# Patient Record
Sex: Male | Born: 1970 | Race: White | Hispanic: No | Marital: Married | Smoking: Current every day smoker
Health system: Southern US, Community
[De-identification: ages and names within clinical notes are randomized; demographics above are authoritative.]

## PROBLEM LIST (undated history)

## (undated) DIAGNOSIS — M419 Scoliosis, unspecified: Secondary | ICD-10-CM

## (undated) HISTORY — DX: Scoliosis, unspecified: M41.9

---

## 2016-12-12 ENCOUNTER — Encounter: Payer: Self-pay | Admitting: *Deleted

## 2016-12-13 ENCOUNTER — Encounter: Payer: Self-pay | Admitting: Diagnostic Neuroimaging

## 2016-12-13 ENCOUNTER — Inpatient Hospital Stay (HOSPITAL_COMMUNITY)
Admission: EM | Admit: 2016-12-13 | Discharge: 2016-12-18 | DRG: 098 | Disposition: A | Discharge status: Home / Self Care | Payer: Managed Care, Other (non HMO) | Attending: Family Medicine | Admitting: Family Medicine

## 2016-12-13 ENCOUNTER — Ambulatory Visit (INDEPENDENT_AMBULATORY_CARE_PROVIDER_SITE_OTHER): Payer: Managed Care, Other (non HMO) | Admitting: Diagnostic Neuroimaging

## 2016-12-13 ENCOUNTER — Emergency Department (HOSPITAL_COMMUNITY): Payer: Managed Care, Other (non HMO)

## 2016-12-13 ENCOUNTER — Encounter (HOSPITAL_COMMUNITY): Payer: Self-pay

## 2016-12-13 VITALS — BP 125/83 | HR 64 | Ht 68.0 in | Wt 162.2 lb

## 2016-12-13 DIAGNOSIS — E559 Vitamin D deficiency, unspecified: Secondary | ICD-10-CM | POA: Diagnosis present

## 2016-12-13 DIAGNOSIS — M5106 Intervertebral disc disorders with myelopathy, lumbar region: Secondary | ICD-10-CM | POA: Diagnosis present

## 2016-12-13 DIAGNOSIS — R29898 Other symptoms and signs involving the musculoskeletal system: Secondary | ICD-10-CM

## 2016-12-13 DIAGNOSIS — R200 Anesthesia of skin: Secondary | ICD-10-CM

## 2016-12-13 DIAGNOSIS — G373 Acute transverse myelitis in demyelinating disease of central nervous system: Principal | ICD-10-CM | POA: Diagnosis present

## 2016-12-13 DIAGNOSIS — M5000 Cervical disc disorder with myelopathy, unspecified cervical region: Secondary | ICD-10-CM | POA: Diagnosis present

## 2016-12-13 DIAGNOSIS — R202 Paresthesia of skin: Secondary | ICD-10-CM

## 2016-12-13 DIAGNOSIS — G959 Disease of spinal cord, unspecified: Secondary | ICD-10-CM | POA: Diagnosis present

## 2016-12-13 DIAGNOSIS — D72828 Other elevated white blood cell count: Secondary | ICD-10-CM | POA: Diagnosis not present

## 2016-12-13 DIAGNOSIS — F17210 Nicotine dependence, cigarettes, uncomplicated: Secondary | ICD-10-CM | POA: Diagnosis present

## 2016-12-13 DIAGNOSIS — Z79899 Other long term (current) drug therapy: Secondary | ICD-10-CM

## 2016-12-13 DIAGNOSIS — R292 Abnormal reflex: Secondary | ICD-10-CM | POA: Diagnosis not present

## 2016-12-13 DIAGNOSIS — E538 Deficiency of other specified B group vitamins: Secondary | ICD-10-CM | POA: Diagnosis present

## 2016-12-13 DIAGNOSIS — M419 Scoliosis, unspecified: Secondary | ICD-10-CM | POA: Diagnosis present

## 2016-12-13 LAB — CBC
HEMATOCRIT: 48.1 % (ref 39.0–52.0)
Hemoglobin: 16.8 g/dL (ref 13.0–17.0)
MCH: 31.8 pg (ref 26.0–34.0)
MCHC: 34.9 g/dL (ref 30.0–36.0)
MCV: 90.9 fL (ref 78.0–100.0)
Platelets: 256 10*3/uL (ref 150–400)
RBC: 5.29 MIL/uL (ref 4.22–5.81)
RDW: 12.7 % (ref 11.5–15.5)
WBC: 13.6 10*3/uL — AB (ref 4.0–10.5)

## 2016-12-13 LAB — BASIC METABOLIC PANEL
Anion gap: 8 (ref 5–15)
BUN: 10 mg/dL (ref 6–20)
CO2: 25 mmol/L (ref 22–32)
Calcium: 9.1 mg/dL (ref 8.9–10.3)
Chloride: 107 mmol/L (ref 101–111)
Creatinine, Ser: 1.03 mg/dL (ref 0.61–1.24)
GFR calc Af Amer: >60 mL/min (ref >60)
GFR calc non Af Amer: >60 mL/min (ref >60)
Glucose, Bld: 95 mg/dL (ref 65–99)
Potassium: 3.8 mmol/L (ref 3.5–5.1)
Sodium: 140 mmol/L (ref 135–145)

## 2016-12-13 MED ORDER — GABAPENTIN 300 MG PO CAPS: 300.0000 mg | ORAL_CAPSULE | Freq: Three times a day (TID) | ORAL | 6 refills | Status: DC

## 2016-12-13 MED ORDER — SODIUM CHLORIDE 0.9% FLUSH
3.0000 mL | Freq: Two times a day (BID) | INTRAVENOUS | Status: DC
  Administered 2016-12-13 – 2016-12-18 (×10): 3 mL via INTRAVENOUS

## 2016-12-13 MED ORDER — GABAPENTIN 300 MG PO CAPS
300.0000 mg | ORAL_CAPSULE | Freq: Three times a day (TID) | ORAL | Status: DC
  Administered 2016-12-14 – 2016-12-18 (×13): 300 mg via ORAL
  Filled 2016-12-13 (×13): qty 1

## 2016-12-13 MED ORDER — LORAZEPAM 2 MG/ML IJ SOLN
1.0000 mg | Freq: Once | INTRAMUSCULAR | Status: AC
  Administered 2016-12-13: 1 mg via INTRAVENOUS
  Filled 2016-12-13: qty 1

## 2016-12-13 MED ORDER — SODIUM CHLORIDE 0.9% FLUSH: 3.0000 mL | INTRAVENOUS | Status: DC | PRN

## 2016-12-13 MED ORDER — GADOBENATE DIMEGLUMINE 529 MG/ML IV SOLN
15.0000 mL | Freq: Once | INTRAVENOUS | Status: AC
  Administered 2016-12-13: 15 mL via INTRAVENOUS

## 2016-12-13 MED ORDER — SODIUM CHLORIDE 0.9 % IV SOLN: 250.0000 mL | INTRAVENOUS | Status: DC | PRN

## 2016-12-13 NOTE — ED Provider Notes (Signed)
Sent for mri.  c/f spinal cord compression syndrome.  MR brain, cervical, lumbar, and thoracic pending.  Numbness in sole of feet over 2 week with saddle anesthesia developing over past week as well as left shoulder pain.  Results of MRI show intramedullary t11 lesion  Discussed findings with neurology. Will recommend admission for LP and further workup. Pt will be admitted to medicine for further management and evaluaiton.    Chad Webb, Chad Schwieger, MD 12/14/16 1331    Blane OharaZavitz, Joshua, MD 12/14/16 [phone]

## 2016-12-13 NOTE — ED Triage Notes (Signed)
Pt presents for MRI from neurologist, Penemalli MD. Reports had 2 weeks of bilateral feet numbness, denies weakness. Ambulatory, AxO x4. Pt denies other medical problems or injury.

## 2016-12-13 NOTE — H&P (Signed)
History and Physical    Chad MayersBarry Bogdon HYQ:[id number] DOB: 1970-12-18 DOA: 12/13/2016  PCP: Roger KillWilliams, Breejante J, PA-C  Patient coming from:  Home, neuro clinic  Chief Complaint:   numbness  HPI: Chad MayersBarry Favorite is a 46 y.o. male with medical history significant of scoliosis sent in for stat MRI of his back by neuro clinic for one week of bilateral feet numbness and tingling along with numbness to his perineal area.  He reports no urinary or bowel incontinence.  No fevers.  No headache.  No blurred vision.  No recent trauma or vehicle accident.  Pt went to be evaluated in neuro clinic who sent to ED for stat imaging.  Mri shows lesion In lower spine.  Neuro here called who advised obs pt for possible LP work up.  Pt has not been on steroids recently.  Has no h/o multiple sclerosis.   Review of Systems: As per HPI otherwise 10 point review of systems negative.   Past Medical History:  Diagnosis Date  . Scoliosis     History reviewed. No pertinent surgical history.   reports that he has been smoking.  He has been smoking about 1.00 pack per day. He has never used smokeless tobacco. He reports that he drinks alcohol. He reports that he does not use drugs.  Allergies  Allergen Reactions  . Latex Rash    Family History  Problem Relation Age of Onset  . Ataxia Maternal Grandmother     Prior to Admission medications   Medication Sig Start Date End Date Taking? Authorizing Provider  cyanocobalamin 500 MCG tablet Take 500 mcg by mouth daily.   Yes [provider]  gabapentin (NEURONTIN) 300 MG capsule Take 1 capsule (300 mg total) by mouth 3 (three) times daily. Patient not taking: Reported on 12/13/2016 12/13/16   Suanne MarkerPenumalli, Vikram R, MD    Physical Exam: Vitals:   12/13/16 1230 12/13/16 1315 12/13/16 1330 12/13/16 1345  BP: 130/87 (!) 133/92 (!) 140/91 (!) 132/93  Pulse: (!) 57 (!) 55 (!) 53 64  Resp:   16 16  Temp:      TempSrc:      SpO2: 98% 97% 98% 98%     Constitutional: NAD, calm, comfortable Vitals:   12/13/16 1230 12/13/16 1315 12/13/16 1330 12/13/16 1345  BP: 130/87 (!) 133/92 (!) 140/91 (!) 132/93  Pulse: (!) 57 (!) 55 (!) 53 64  Resp:   16 16  Temp:      TempSrc:      SpO2: 98% 97% 98% 98%   Eyes: PERRL, lids and conjunctivae normal ENMT: Mucous membranes are moist. Posterior pharynx clear of any exudate or lesions.Normal dentition.  Neck: normal, supple, no masses, no thyromegaly Respiratory: clear to auscultation bilaterally, no wheezing, no crackles. Normal respiratory effort. No accessory muscle use.  Cardiovascular: Regular rate and rhythm, no murmurs / rubs / gallops. No extremity edema. 2+ pedal pulses. No carotid bruits.  Abdomen: no tenderness, no masses palpated. No hepatosplenomegaly. Bowel sounds positive.  Musculoskeletal: no clubbing / cyanosis. No joint deformity upper and lower extremities. Good ROM, no contractures. Normal muscle tone.  Skin: no rashes, lesions, ulcers. No induration Neurologic: CN 2-12 grossly intact.  DTR normal. Strength 5/5 in all 4.  Psychiatric: Normal judgment and insight. Alert and oriented x 3. Normal mood.    Labs on Admission: I have personally reviewed following labs and imaging studies  CBC: No results for input(s): WBC, NEUTROABS, HGB, HCT, MCV, PLT in the last 168 hours.  Basic Metabolic Panel:  Recent Labs Lab 12/13/16 1155  NA 140  K 3.8  CL 107  CO2 25  GLUCOSE 95  BUN 10  CREATININE 1.03  CALCIUM 9.1   GFR: Estimated Creatinine Clearance: 85.8 mL/min (by C-G formula based on SCr of 1.03 mg/dL).  Radiological Exams on Admission: Mr Laqueta Jean And Wo Contrast  Result Date: 12/13/2016 CLINICAL DATA:  Myelopathy. EXAM: MRI HEAD WITHOUT AND WITH CONTRAST TECHNIQUE: Multiplanar, multiecho pulse sequences of the brain and surrounding structures were obtained without and with intravenous contrast. CONTRAST:  15mL MULTIHANCE GADOBENATE DIMEGLUMINE 529 MG/ML IV SOLN  COMPARISON:  None. FINDINGS: Brain: 2 small FLAIR hyperintensities in the cerebral white matter. No specific demyelinating pattern. No signal abnormality along the third ventricle or cerebral aqua duct. No acute infarct, hemorrhage, hydrocephalus, or mass. Normal brain volume. Vascular: Normal flow voids. Skull and upper cervical spine: Negative. Sinuses/Orbits: No evidence of optic neuritis. Small mucous retention cysts in the right sphenoid sinus. IMPRESSION: No explanation for myelopathy.  No demyelinating pattern. Electronically Signed   By: Marnee Spring M.D.   On: 12/13/2016 17:38   Mr Cervical Spine W Or Wo Contrast  Result Date: 12/13/2016 CLINICAL DATA:  Paresthesia on the plantar feet. Saddle anesthesia. Left shoulder sensation abnormality. EXAM: MRI CERVICAL, THORACIC AND LUMBAR SPINE WITHOUT CONTRAST TECHNIQUE: Multiplanar and multiecho pulse sequences of the cervical spine, to include the craniocervical junction and cervicothoracic junction, and thoracic and lumbar spine, were obtained without intravenous contrast. COMPARISON:  None. FINDINGS: MRI CERVICAL SPINE FINDINGS Alignment: Physiologic. Vertebrae: No fracture, evidence of discitis, or bone lesion. Cord: Normal signal and morphology. Posterior Fossa, vertebral arteries, paraspinal tissues: Negative. Disc levels: C2-3: Unremarkable. C3-4: Shallow disc protrusion and endplate ridging central to right paracentral. No impingement C4-5: Unremarkable. C5-6: Shallow rightward disc protrusion and minimal endplate ridging. Patent canal and foramina C6-7: Left foraminal protrusion or uncovertebral ridge with mild stenosis (axial T2 weighted imaging over estimates narrowing due to slice selection). Mild posterior endplate ridging and disc bulging. C7-T1:Unremarkable. MRI THORACIC SPINE FINDINGS Alignment: Exaggerated kyphosis. 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  Vertebrae: Multiple endplate irregularities. No acute fracture, discitis, or aggressive bone lesion. Cord: There is a discrete 8 mm intramedullary signal abnormality with subtle expansion and hazy faint enhancement. The lesion was skipped over on axial images but appears to be central in the cord. Differential considerations: Demyelination- this would be a solitary lesion as there is no specific demyelinating pattern in the brain or elsewhere in the cord. This is a short segment lesion unlike transverse myelitis. Neoplasm- hemangioblastoma or ependymoma could have this appearance, hemangioma is usually more hypervascular and ependymoma is usually larger. This would be a small lesion for astrocytoma. Medullary metastases rare and there is no history of malignancy. Infection- short-segment, unlike transverse myelitis. No ring enhancement for infectious collection. Vascular- this would be an atypical pattern. Posttraumatic - no posttraumatic soft tissue/osseous findings. This is not at a disc level to suggest contusion. Inflammatory- neurosarcoid should be considered. Paraspinal and other soft tissues: Negative Disc levels: T5-6: Central disc herniation contacting the ventral cord with ventral cord flattening MRI LUMBAR SPINE FINDINGS Segmentation:  Standard. Alignment:  Physiologic. Vertebrae:  No fracture, evidence of discitis, or bone lesion. Conus medullaris: Extends to the L1 level and appears normal. Paraspinal and other soft tissues: Negative Disc levels: L5-S1: Disc protrusion eccentric to the right. This could contact the S1 nerve root but no  mass effect. IMPRESSION: 1. 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 Please see above differential diagnosis in the thoracic spine "cord" header. 2. Overall mild degenerative changes in the cervical, thoracic, and lumbar spine which are described above. No high-grade stenosis. Electronically Signed   By: Marnee Spring M.D.   On: 12/13/2016 17:34    Mr Thoracic Spine W Wo Contrast  Result Date: 12/13/2016 CLINICAL DATA:  Paresthesia on the plantar feet. Saddle anesthesia. Left shoulder sensation abnormality. EXAM: MRI CERVICAL, THORACIC AND LUMBAR SPINE WITHOUT CONTRAST TECHNIQUE: Multiplanar and multiecho pulse sequences of the cervical spine, to include the craniocervical junction and cervicothoracic junction, and thoracic and lumbar spine, were obtained without intravenous contrast. COMPARISON:  None. FINDINGS: MRI CERVICAL SPINE FINDINGS Alignment: Physiologic. Vertebrae: No fracture, evidence of discitis, or bone lesion. Cord: Normal signal and morphology. Posterior Fossa, vertebral arteries, paraspinal tissues: Negative. Disc levels: C2-3: Unremarkable. C3-4: Shallow disc protrusion and endplate ridging central to right paracentral. No impingement C4-5: Unremarkable. C5-6: Shallow rightward disc protrusion and minimal endplate ridging. Patent canal and foramina C6-7: Left foraminal protrusion or uncovertebral ridge with mild stenosis (axial T2 weighted imaging over estimates narrowing due to slice selection). Mild posterior endplate ridging and disc bulging. C7-T1:Unremarkable. MRI THORACIC SPINE FINDINGS Alignment: Exaggerated kyphosis. 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 Vertebrae: Multiple endplate irregularities. No acute fracture, discitis, or aggressive bone lesion. Cord: There is a discrete 8 mm intramedullary signal abnormality with subtle expansion and hazy faint enhancement. The lesion was skipped over on axial images but appears to be central in the cord. Differential considerations: Demyelination- this would be a solitary lesion as there is no specific demyelinating pattern in the brain or elsewhere in the cord. This is a short segment lesion unlike transverse myelitis. Neoplasm- hemangioblastoma or ependymoma could have this appearance, hemangioma  is usually more hypervascular and ependymoma is usually larger. This would be a small lesion for astrocytoma. Medullary metastases rare and there is no history of malignancy. Infection- short-segment, unlike transverse myelitis. No ring enhancement for infectious collection. Vascular- this would be an atypical pattern. Posttraumatic - no posttraumatic soft tissue/osseous findings. This is not at a disc level to suggest contusion. Inflammatory- neurosarcoid should be considered. Paraspinal and other soft tissues: Negative Disc levels: T5-6: Central disc herniation contacting the ventral cord with ventral cord flattening MRI LUMBAR SPINE FINDINGS Segmentation:  Standard. Alignment:  Physiologic. Vertebrae:  No fracture, evidence of discitis, or bone lesion. Conus medullaris: Extends to the L1 level and appears normal. Paraspinal and other soft tissues: Negative Disc levels: L5-S1: Disc protrusion eccentric to the right. This could contact the S1 nerve root but no mass effect. IMPRESSION: 1. 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 Please see above differential diagnosis in the thoracic spine "cord" header. 2. Overall mild degenerative changes in the cervical, thoracic, and lumbar spine which are described above. No high-grade stenosis. Electronically Signed   By: Marnee Spring M.D.   On: 12/13/2016 17:34   Mr Lumbar Spine W Wo Contrast  Result Date: 12/13/2016 CLINICAL DATA:  Paresthesia on the plantar feet. Saddle anesthesia. Left shoulder sensation abnormality. EXAM: MRI CERVICAL, THORACIC AND LUMBAR SPINE WITHOUT CONTRAST TECHNIQUE: Multiplanar and multiecho pulse sequences of the cervical spine, to include the craniocervical junction and cervicothoracic junction, and thoracic and lumbar spine, were obtained without intravenous contrast. COMPARISON:  None. FINDINGS: MRI CERVICAL SPINE FINDINGS Alignment: Physiologic. Vertebrae: No fracture, evidence of  discitis, or bone lesion. Cord:  Normal signal and morphology. Posterior Fossa, vertebral arteries, paraspinal tissues: Negative. Disc levels: C2-3: Unremarkable. C3-4: Shallow disc protrusion and endplate ridging central to right paracentral. No impingement C4-5: Unremarkable. C5-6: Shallow rightward disc protrusion and minimal endplate ridging. Patent canal and foramina C6-7: Left foraminal protrusion or uncovertebral ridge with mild stenosis (axial T2 weighted imaging over estimates narrowing due to slice selection). Mild posterior endplate ridging and disc bulging. C7-T1:Unremarkable. MRI THORACIC SPINE FINDINGS Alignment: Exaggerated kyphosis. 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 Vertebrae: Multiple endplate irregularities. No acute fracture, discitis, or aggressive bone lesion. Cord: There is a discrete 8 mm intramedullary signal abnormality with subtle expansion and hazy faint enhancement. The lesion was skipped over on axial images but appears to be central in the cord. Differential considerations: Demyelination- this would be a solitary lesion as there is no specific demyelinating pattern in the brain or elsewhere in the cord. This is a short segment lesion unlike transverse myelitis. Neoplasm- hemangioblastoma or ependymoma could have this appearance, hemangioma is usually more hypervascular and ependymoma is usually larger. This would be a small lesion for astrocytoma. Medullary metastases rare and there is no history of malignancy. Infection- short-segment, unlike transverse myelitis. No ring enhancement for infectious collection. Vascular- this would be an atypical pattern. Posttraumatic - no posttraumatic soft tissue/osseous findings. This is not at a disc level to suggest contusion. Inflammatory- neurosarcoid should be considered. Paraspinal and other soft tissues: Negative Disc levels: T5-6: Central disc herniation contacting the ventral cord  with ventral cord flattening MRI LUMBAR SPINE FINDINGS Segmentation:  Standard. Alignment:  Physiologic. Vertebrae:  No fracture, evidence of discitis, or bone lesion. Conus medullaris: Extends to the L1 level and appears normal. Paraspinal and other soft tissues: Negative Disc levels: L5-S1: Disc protrusion eccentric to the right. This could contact the S1 nerve root but no mass effect. IMPRESSION: 1. 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 Please see above differential diagnosis in the thoracic spine "cord" header. 2. Overall mild degenerative changes in the cervical, thoracic, and lumbar spine which are described above. No high-grade stenosis. Electronically Signed   By: Marnee Spring M.D.   On: 12/13/2016 17:34    Case discussed with EDP Chart reviewed   Assessment/Plan 46 yo male with one week of ble numbness no loss of strength with saddle anesthesia found to have spinal lesion of uncertain etiology  Principal Problem:   Spinal cord lesion (HCC)- neuro to see.  Hold anticoagulants in case needs LP.  freq neuro checks overnight.  obs on med surg.  Active Problems:   Saddle anesthesia- noted as above, check TSH   Numbness and tingling of both lower extremities- as above   Further recommendations pending neuro evaluation.  Cbc is pending.    DVT prophylaxis: scds Code Status:  full Family Communication: none Disposition Plan:  Per day team Consults called: neurology Admission status:  observation   DAVID,RACHAL A MD [facility]  If 7PM-7AM, please contact night-coverage [email or web address] Password [facility]  12/13/2016, 8:48 PM

## 2016-12-13 NOTE — Consult Note (Signed)
NEURO HOSPITALIST CONSULT NOTE   Requestig physician: Dr. Jodi Mourning  Reason for Consult: Myelopathy  History obtained from:   Patient and Chart     HPI:                                                                                                                                          Chad Webb is an 46 y.o. male who was sent to the ED from the Neurology clinic today for an expedited MRI of his brain, cervical, thoracic and lumbar spine. He presented to the clinic with a 2 week history of bilateral foot numbness (soles of feet) and tingling. The numbness began acutely, first noticed on awakening. The symptoms affected both feet up to the ankles, sparing the legs and thighs.  He also noticed some balance and walking difficulty, feeling unsteady when walking as though he could roll his ankle. One week later, he developed saddle anesthesia. He could not feel when wiping with toilet paper. He has not been incontinent of bladder or bowel. The patient denied any numbness or weakness from his knees up to his hips. He has had no numbness in his chest or abdomen. There has been no arm weakness or sensory numbness.   He initially went to see his PCP, who noted that his B12 level was low at 221. His white count at that time was found to be elevated at 14.4. Today, he saw Dr. Marjory Lies at the Neurology clinic. Exam there showed lower extremity weakness left worse than right, saddle anesthesia and hyperreflexia in the upper and lower extremities,  todayThere were no unusual events prior to onset of symptoms, including no fever, headache, blurry vision or headache. He denies any recent infection. Also no change in diet or activity. He does complain of some left shoulder pain, which began in November 2017 acutely when reaching and he felt something pop. He has had no recent toxin exposures, has not had symptoms of infection and has not had any back pain.   MRI of thoracic spine revealed an  enhancing lesion within the cord at the T11 level appearing most consistent with acute inflammatory demyelination: There is a discrete 8 mm intramedullary signal abnormality with subtle expansion and hazy faint enhancement. Differential considerations per Radiology report: Demyelination- this would be a solitary lesion as there is no specific demyelinating pattern in the brain or elsewhere in the cord. This is a short segment lesion unlike transverse myelitis. Neoplasm- hemangioblastoma or ependymoma could have this appearance, hemangioma is usually more hypervascular and ependymoma is usually larger. This would be a small lesion for astrocytoma. Medullary metastases rare and there is no history of malignancy. Infection- short-segment, unlike transverse myelitis. No ring enhancement for infectious collection. Vascular- this would be an atypical pattern. Posttraumatic -  no posttraumatic soft tissue/osseous findings. This is not at a disc level to suggest contusion. Inflammatory- neurosarcoid should be considered.  The patient has a history of scoliosis, but other than the recently diagnosed B12 deficiency and smoking, he has been healthy. He has no history of multiple sclerosis.   Past Medical History:  Diagnosis Date  . Scoliosis     History reviewed. No pertinent surgical history.  Family History  Problem Relation Age of Onset  . Ataxia Maternal Grandmother     Social History:  reports that he has been smoking.  He has been smoking about 1.00 pack per day. He has never used smokeless tobacco. He reports that he drinks alcohol. He reports that he does not use drugs.  Allergies  Allergen Reactions  . Latex Rash    MEDICATIONS:                                                                                                                     Prior to Admission:  Prescriptions Prior to Admission  Medication Sig Dispense Refill Last Dose  . cyanocobalamin 500 MCG tablet Take 500 mcg by mouth  daily.   12/12/2016 at Unknown time  . gabapentin (NEURONTIN) 300 MG capsule Take 1 capsule (300 mg total) by mouth 3 (three) times daily. (Patient not taking: Reported on 12/13/2016) 90 capsule 6 Not Taking at Unknown time     ROS:                                                                                                                                       No headache, neck pain, back pain, CP, SOB, abdominal pain, diarrhea, N/V, or wheezing. Has an occasional cough.   Blood pressure (!) 132/93, pulse 64, temperature 98.5 F (36.9 C), temperature source Oral, resp. rate 16, SpO2 98 %.  General Examination:  HEENT-  [place]/AT    Lungs- Respirations unlabored.  Extremities- No edema.   Neurological Examination Mental Status: Alert, oriented, thought content appropriate.  Speech fluent without evidence of aphasia.  Able to follow all commands without difficulty. Cranial Nerves: II: Visual fields intact, PERRL III,IV, VI: ptosis not present, EOMI without nystagmus V,VII: smile symmetric, facial temp sensation normal bilaterally VIII: hearing intact to conversation IX,X: palate rises symmetrically XI: symmetric XII: midline tongue extension Motor: Bilateral upper extremities: 5/5 Bilateral lower extremities 5/5 proximally with 4+/5 ankle dorsi/plantar flexion Normal tone throughout; no atrophy noted Sensory: Temperature and light touch intact in upper extremities and above the knees. Between knees and ankles, there is decreased temperature and FT sensation; temperature sensation loss to feet is severe. There is moderate to severely decreased proprioception of the toes bilaterally. ,  Deep Tendon Reflexes: Positive Hoffman's sign on right, negative on left. 3+ right brachioradialis and biceps, 2+ left brachioradialis and biceps. 3+ patellae and achilles bilaterally. Right toe downgoing, left  toe upgoing.  Cerebellar: No ataxia with FNF or heel shin bilaterally.  Gait: Short-stepped, spastic appearing unsteady gait with positive Romberg.   Lab Results: Basic Metabolic Panel:  Recent Labs Lab 12/13/16 1155  NA 140  K 3.8  CL 107  CO2 25  GLUCOSE 95  BUN 10  CREATININE 1.03  CALCIUM 9.1    Liver Function Tests: No results for input(s): AST, ALT, ALKPHOS, BILITOT, PROT, ALBUMIN in the last 168 hours. No results for input(s): LIPASE, AMYLASE in the last 168 hours. No results for input(s): AMMONIA in the last 168 hours.  CBC: No results for input(s): WBC, NEUTROABS, HGB, HCT, MCV, PLT in the last 168 hours.  Cardiac Enzymes: No results for input(s): CKTOTAL, CKMB, CKMBINDEX, TROPONINI in the last 168 hours.  Lipid Panel: No results for input(s): CHOL, TRIG, HDL, CHOLHDL, VLDL, LDLCALC in the last 168 hours.  CBG: No results for input(s): GLUCAP in the last 168 hours.  Microbiology: No results found for this or any previous visit.  Coagulation Studies: No results for input(s): LABPROT, INR in the last 72 hours.  Imaging: Mr Laqueta Jean And Wo Contrast  Result Date: 12/13/2016 CLINICAL DATA:  Myelopathy. EXAM: MRI HEAD WITHOUT AND WITH CONTRAST TECHNIQUE: Multiplanar, multiecho pulse sequences of the brain and surrounding structures were obtained without and with intravenous contrast. CONTRAST:  15mL MULTIHANCE GADOBENATE DIMEGLUMINE 529 MG/ML IV SOLN COMPARISON:  None. FINDINGS: Brain: 2 small FLAIR hyperintensities in the cerebral white matter. No specific demyelinating pattern. No signal abnormality along the third ventricle or cerebral aqua duct. No acute infarct, hemorrhage, hydrocephalus, or mass. Normal brain volume. Vascular: Normal flow voids. Skull and upper cervical spine: Negative. Sinuses/Orbits: No evidence of optic neuritis. Small mucous retention cysts in the right sphenoid sinus. IMPRESSION: No explanation for myelopathy.  No demyelinating pattern.  Electronically Signed   By: Marnee Spring M.D.   On: 12/13/2016 17:38   Mr Cervical Spine W Or Wo Contrast  Result Date: 12/13/2016 CLINICAL DATA:  Paresthesia on the plantar feet. Saddle anesthesia. Left shoulder sensation abnormality. EXAM: MRI CERVICAL, THORACIC AND LUMBAR SPINE WITHOUT CONTRAST TECHNIQUE: Multiplanar and multiecho pulse sequences of the cervical spine, to include the craniocervical junction and cervicothoracic junction, and thoracic and lumbar spine, were obtained without intravenous contrast. COMPARISON:  None. FINDINGS: MRI CERVICAL SPINE FINDINGS Alignment: Physiologic. Vertebrae: No fracture, evidence of discitis, or bone lesion. Cord: Normal signal and morphology. Posterior Fossa, vertebral arteries, paraspinal tissues: Negative. Disc levels: C2-3: Unremarkable.  C3-4: Shallow disc protrusion and endplate ridging central to right paracentral. No impingement C4-5: Unremarkable. C5-6: Shallow rightward disc protrusion and minimal endplate ridging. Patent canal and foramina C6-7: Left foraminal protrusion or uncovertebral ridge with mild stenosis (axial T2 weighted imaging over estimates narrowing due to slice selection). Mild posterior endplate ridging and disc bulging. C7-T1:Unremarkable. MRI THORACIC SPINE FINDINGS Alignment: Exaggerated kyphosis. 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 Vertebrae: Multiple endplate irregularities. No acute fracture, discitis, or aggressive bone lesion. Cord: There is a discrete 8 mm intramedullary signal abnormality with subtle expansion and hazy faint enhancement. The lesion was skipped over on axial images but appears to be central in the cord. Differential considerations: Demyelination- this would be a solitary lesion as there is no specific demyelinating pattern in the brain or elsewhere in the cord. This is a short segment lesion unlike transverse myelitis.  Neoplasm- hemangioblastoma or ependymoma could have this appearance, hemangioma is usually more hypervascular and ependymoma is usually larger. This would be a small lesion for astrocytoma. Medullary metastases rare and there is no history of malignancy. Infection- short-segment, unlike transverse myelitis. No ring enhancement for infectious collection. Vascular- this would be an atypical pattern. Posttraumatic - no posttraumatic soft tissue/osseous findings. This is not at a disc level to suggest contusion. Inflammatory- neurosarcoid should be considered. Paraspinal and other soft tissues: Negative Disc levels: T5-6: Central disc herniation contacting the ventral cord with ventral cord flattening MRI LUMBAR SPINE FINDINGS Segmentation:  Standard. Alignment:  Physiologic. Vertebrae:  No fracture, evidence of discitis, or bone lesion. Conus medullaris: Extends to the L1 level and appears normal. Paraspinal and other soft tissues: Negative Disc levels: L5-S1: Disc protrusion eccentric to the right. This could contact the S1 nerve root but no mass effect. IMPRESSION: 1. 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 Please see above differential diagnosis in the thoracic spine "cord" header. 2. Overall mild degenerative changes in the cervical, thoracic, and lumbar spine which are described above. No high-grade stenosis. Electronically Signed   By: Marnee SpringJonathon  Watts M.D.   On: 12/13/2016 17:34   Mr Thoracic Spine W Wo Contrast  Result Date: 12/13/2016 CLINICAL DATA:  Paresthesia on the plantar feet. Saddle anesthesia. Left shoulder sensation abnormality. EXAM: MRI CERVICAL, THORACIC AND LUMBAR SPINE WITHOUT CONTRAST TECHNIQUE: Multiplanar and multiecho pulse sequences of the cervical spine, to include the craniocervical junction and cervicothoracic junction, and thoracic and lumbar spine, were obtained without intravenous contrast. COMPARISON:  None. FINDINGS: MRI CERVICAL SPINE FINDINGS Alignment:  Physiologic. Vertebrae: No fracture, evidence of discitis, or bone lesion. Cord: Normal signal and morphology. Posterior Fossa, vertebral arteries, paraspinal tissues: Negative. Disc levels: C2-3: Unremarkable. C3-4: Shallow disc protrusion and endplate ridging central to right paracentral. No impingement C4-5: Unremarkable. C5-6: Shallow rightward disc protrusion and minimal endplate ridging. Patent canal and foramina C6-7: Left foraminal protrusion or uncovertebral ridge with mild stenosis (axial T2 weighted imaging over estimates narrowing due to slice selection). Mild posterior endplate ridging and disc bulging. C7-T1:Unremarkable. MRI THORACIC SPINE FINDINGS Alignment: Exaggerated kyphosis. 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 Vertebrae: Multiple endplate irregularities. No acute fracture, discitis, or aggressive bone lesion. Cord: There is a discrete 8 mm intramedullary signal abnormality with subtle expansion and hazy faint enhancement. The lesion was skipped over on axial images but appears to be central in the cord. Differential considerations: Demyelination- this would be a solitary lesion as  there is no specific demyelinating pattern in the brain or elsewhere in the cord. This is a short segment lesion unlike transverse myelitis. Neoplasm- hemangioblastoma or ependymoma could have this appearance, hemangioma is usually more hypervascular and ependymoma is usually larger. This would be a small lesion for astrocytoma. Medullary metastases rare and there is no history of malignancy. Infection- short-segment, unlike transverse myelitis. No ring enhancement for infectious collection. Vascular- this would be an atypical pattern. Posttraumatic - no posttraumatic soft tissue/osseous findings. This is not at a disc level to suggest contusion. Inflammatory- neurosarcoid should be considered. Paraspinal and other soft tissues:  Negative Disc levels: T5-6: Central disc herniation contacting the ventral cord with ventral cord flattening MRI LUMBAR SPINE FINDINGS Segmentation:  Standard. Alignment:  Physiologic. Vertebrae:  No fracture, evidence of discitis, or bone lesion. Conus medullaris: Extends to the L1 level and appears normal. Paraspinal and other soft tissues: Negative Disc levels: L5-S1: Disc protrusion eccentric to the right. This could contact the S1 nerve root but no mass effect. IMPRESSION: 1. 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 Please see above differential diagnosis in the thoracic spine "cord" header. 2. Overall mild degenerative changes in the cervical, thoracic, and lumbar spine which are described above. No high-grade stenosis. Electronically Signed   By: Marnee Spring M.D.   On: 12/13/2016 17:34   Mr Lumbar Spine W Wo Contrast  Result Date: 12/13/2016 CLINICAL DATA:  Paresthesia on the plantar feet. Saddle anesthesia. Left shoulder sensation abnormality. EXAM: MRI CERVICAL, THORACIC AND LUMBAR SPINE WITHOUT CONTRAST TECHNIQUE: Multiplanar and multiecho pulse sequences of the cervical spine, to include the craniocervical junction and cervicothoracic junction, and thoracic and lumbar spine, were obtained without intravenous contrast. COMPARISON:  None. FINDINGS: MRI CERVICAL SPINE FINDINGS Alignment: Physiologic. Vertebrae: No fracture, evidence of discitis, or bone lesion. Cord: Normal signal and morphology. Posterior Fossa, vertebral arteries, paraspinal tissues: Negative. Disc levels: C2-3: Unremarkable. C3-4: Shallow disc protrusion and endplate ridging central to right paracentral. No impingement C4-5: Unremarkable. C5-6: Shallow rightward disc protrusion and minimal endplate ridging. Patent canal and foramina C6-7: Left foraminal protrusion or uncovertebral ridge with mild stenosis (axial T2 weighted imaging over estimates narrowing due to slice selection). Mild posterior endplate  ridging and disc bulging. C7-T1:Unremarkable. MRI THORACIC SPINE FINDINGS Alignment: Exaggerated kyphosis. 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 Vertebrae: Multiple endplate irregularities. No acute fracture, discitis, or aggressive bone lesion. Cord: There is a discrete 8 mm intramedullary signal abnormality with subtle expansion and hazy faint enhancement. The lesion was skipped over on axial images but appears to be central in the cord. Differential considerations: Demyelination- this would be a solitary lesion as there is no specific demyelinating pattern in the brain or elsewhere in the cord. This is a short segment lesion unlike transverse myelitis. Neoplasm- hemangioblastoma or ependymoma could have this appearance, hemangioma is usually more hypervascular and ependymoma is usually larger. This would be a small lesion for astrocytoma. Medullary metastases rare and there is no history of malignancy. Infection- short-segment, unlike transverse myelitis. No ring enhancement for infectious collection. Vascular- this would be an atypical pattern. Posttraumatic - no posttraumatic soft tissue/osseous findings. This is not at a disc level to suggest contusion. Inflammatory- neurosarcoid should be considered. Paraspinal and other soft tissues: Negative Disc levels: T5-6: Central disc herniation contacting the ventral cord with ventral cord flattening MRI LUMBAR SPINE FINDINGS Segmentation:  Standard. Alignment:  Physiologic. Vertebrae:  No fracture, evidence of  discitis, or bone lesion. Conus medullaris: Extends to the L1 level and appears normal. Paraspinal and other soft tissues: Negative Disc levels: L5-S1: Disc protrusion eccentric to the right. This could contact the S1 nerve root but no mass effect. IMPRESSION: 1. 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 Please see above differential diagnosis in the  thoracic spine "cord" header. 2. Overall mild degenerative changes in the cervical, thoracic, and lumbar spine which are described above. No high-grade stenosis. Electronically Signed   By: Marnee Spring M.D.   On: 12/13/2016 17:34    Assessment: 47 year old male with sudden onset lower extremity numbness and weakness in the bilateral feet and ankles beginning 2 weeks ago, followed one week later with onset of saddle anesthesia 1. MRI T-spine reveals an enhancing spinal cord lesion at the T11 level.  2. MRI brain unremarkable. No compressive cervical or lumbar spine lesion to account for presentation. Question of subtle T2 hyperintensities within the cervical cord on MRI versus background noise; if they represent tiny foci of chronic gliosis, this would increase the probability of underlying inflammatory demyelinating disease.  3. 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  4. Overall presentation and imaging findings most consistent with acute inflammatory demyelination. Tumor unlikely given relatively diffuse hyperintensity and enhancement of the lesion on MRI. Unusual radiological manifestation of B12 deficiency without dorsal column lesion in the cervical or more rostral segments of the thoracic spinal cord, but given low B12 level, a B12 myelopathy is still relatively high on the DDx. Other DDx includes Lyme disease, neurosarcoidosis and viral myelopathy. Compression has been ruled out with MRI. The appearance of the lesion is not consistent with spinal cord infarction.   Recommendations: 1. Vitamin B12 injections: 1000 mcg subq qd x 7 days, then 1000 mcg subq qweek x 4 weeks, then 2000 mcg po qd as maintenance dosing thereafter.  2. Lumbar puncture (tray to bedside has been ordered). Will need protein, glucose, cell count, albumin, IgG, oligoclonal bands, lyme titer and VDRL.  3. IV methylprednisolone 1000 mg qd x 5 days, first dose now (ordered). 4. Neurontin  prescription filled out today in clinic. Would hold this medication as it may mask the symptoms of tingling and for now this symptom (along with neurological exams) will help in tracking improvement with steroid treatment. Can consider restarting at a later date.  5. ACE level and chest x-ray.  6. PT.  7. Monitor CBG while on steroids.  8. Will need outpatient Neurology follow up after discharge and repeat MRI of lower thoracic spine in 3 months to assess for resolution of the conus medullaris lesion. 9. Serum lyme titer.    Electronically signed: Dr. Caryl Pina 12/13/2016, 7:32 PM

## 2016-12-13 NOTE — Patient Instructions (Signed)
-   I will check MRI cervical, thoracic, lumbar spine studies

## 2016-12-13 NOTE — ED Provider Notes (Signed)
[facility]EMERGENCY DEPT Provider Note   CSN: [id number] Arrival date & time: 12/13/16  1031     History   Chief Complaint Chief Complaint  Patient presents with  . Numbness    HPI Chad Webb is a 46 y.o. male with history of scoliosis who presents with a two-week history of paresthesias to the soles of his feet. Patient has also had saddle anesthesia (paresthesia) for 1 week. Patient reports difficulty ambulating and feeling like his ankles going to turn due to the paresthesias in his feet. Patient was sent by neurologist, Dr. Marjory Lies, for MRI today. Patient also reports having left shoulder pain for a few months. He denies any specific injury, other than raising his arm up one time and feeling some pain. He feels that his shoulder pain may be related. Patient denies any other symptoms. He is not taking any medications at home for his symptoms.  HPI  Past Medical History:  Diagnosis Date  . Scoliosis     There are no active problems to display for this patient.   History reviewed. No pertinent surgical history.     Home Medications    Prior to Admission medications   Medication Sig Start Date End Date Taking? Authorizing Provider  cyanocobalamin 500 MCG tablet Take 500 mcg by mouth daily.   Yes Historical Provider, MD  gabapentin (NEURONTIN) 300 MG capsule Take 1 capsule (300 mg total) by mouth 3 (three) times daily. Patient not taking: Reported on 12/13/2016 12/13/16   Suanne Marker, MD    Family History Family History  Problem Relation Age of Onset  . Ataxia Maternal Grandmother     Social History Social History  Substance Use Topics  . Smoking status: Current Every Day Smoker    Packs/day: 1.00  . Smokeless tobacco: Never Used  . Alcohol use Yes     Comment: weekend     Allergies   Latex   Review of Systems Review of Systems  Constitutional: Negative for chills and fever.  HENT: Negative for facial swelling and sore throat.   Respiratory:  Negative for shortness of breath.   Cardiovascular: Negative for chest pain.  Gastrointestinal: Negative for abdominal pain, nausea and vomiting.  Genitourinary: Negative for dysuria.  Musculoskeletal: Positive for arthralgias (L shoulder). Negative for back pain.  Skin: Negative for rash and wound.  Neurological: Positive for numbness (paresthesia). Negative for headaches.  Psychiatric/Behavioral: The patient is not nervous/anxious.      Physical Exam Updated Vital Signs BP (!) 132/93   Pulse 64   Temp 98.5 F (36.9 C) (Oral)   Resp 16   SpO2 98%   Physical Exam  Constitutional: He appears well-developed and well-nourished. No distress.  HENT:  Head: Normocephalic and atraumatic.  Mouth/Throat: Oropharynx is clear and moist. No oropharyngeal exudate.  Eyes: Conjunctivae and EOM are normal. Pupils are equal, round, and reactive to light. Right eye exhibits no discharge. Left eye exhibits no discharge. No scleral icterus.  Neck: Normal range of motion. Neck supple. No thyromegaly present.  Cardiovascular: Normal rate, regular rhythm, normal heart sounds and intact distal pulses.  Exam reveals no gallop and no friction rub.   No murmur heard. Pulmonary/Chest: Effort normal and breath sounds normal. No stridor. No respiratory distress. He has no wheezes. He has no rales.  Abdominal: Soft. Bowel sounds are normal. He exhibits no distension. There is no tenderness. There is no rebound and no guarding.  Musculoskeletal: He exhibits no edema.  Left shoulder anterior tenderness; full range  of motion; radial pulses intact; normal sensation  Lymphadenopathy:    He has no cervical adenopathy.  Neurological: He is alert. Coordination normal.  CN 3-12 intact; paresthesias to soles of feet, per neurology note earlier today, patient had decreased sensation to perineal area; 5/5 strength in all 4 extremities; equal bilateral grip strength   Skin: Skin is warm and dry. No rash noted. He is not  diaphoretic. No pallor.  Psychiatric: He has a normal mood and affect.  Nursing note and vitals reviewed.    ED Treatments / Results  Labs (all labs ordered are listed, but only abnormal results are displayed) Labs Reviewed  BASIC METABOLIC PANEL    EKG  EKG Interpretation None       Radiology No results found.  Procedures Procedures (including critical care time)  Medications Ordered in ED Medications  LORazepam (ATIVAN) injection 1 mg (1 mg Intravenous Given 12/13/16 1358)  gadobenate dimeglumine (MULTIHANCE) injection 15 mL (15 mLs Intravenous Contrast Given 12/13/16 1615)     Initial Impression / Assessment and Plan / ED Course  I have reviewed the triage vital signs and the nursing notes.  Pertinent labs & imaging results that were available during my care of the patient were reviewed by me and considered in my medical decision making (see chart for details).     MRI of the brain, cervical, thoracic, lumbar spine pending. At shift change, patient care transferred to resident physician, Dr. Marijean Niemannyler Woodrum for continued evaluation, follow up of MRI studies and determination of disposition. Follow-up and consult to [facility]Penumalli [facility] pending results. I discussed patient case with Dr. Jeraldine LootsLockwood who guided the patient's management and agrees with plan.    Final Clinical Impressions(s) / ED Diagnoses   Final diagnoses:  Paresthesia of both feet  Paresthesia of both feet    New Prescriptions New Prescriptions   No medications on file     Emi Holeslexandra M Bond Grieshop, PA-C 12/13/16 1619    Gerhard MunchLockwood, Robert, MD 12/17/16 [phone]

## 2016-12-13 NOTE — ED Notes (Signed)
MD at bedside. 

## 2016-12-13 NOTE — Progress Notes (Addendum)
[facility]  PATIENT: Chad MayersBarry Webb DOB: [birth year]  REFERRING CLINICIAN: B Williams HISTORY FROM: patient and wife  REASON FOR VISIT: new consult    HISTORICAL  CHIEF COMPLAINT:  Chief Complaint  Patient presents with  . Numbness    rm 6, New Pt, wife- Eileen StanfordJenna, "numbness/tingling in both feet x 2 weeks"    HISTORY OF PRESENT ILLNESS:   46 year old right-handed male here for evaluation of lower extremity numbness.  2 weeks ago patient woke up and had sudden onset numbness and tingling in his feet. He notices mainly in the bottom of his feet. Now symptoms affect bilateral feet, up to the ankles. He is also noticed some balance and walking difficulty. He feels unsteady walking his feet. He feels as though he could roll his ankle. One week later he noticed saddle numbness, and his genital/groin area, could not feel when wiping with toilet paper. This has continued. No bowel or bladder incontinence.  Patient denies any numbness or weakness from his knees up to his hips. No numbness in his chest or abdomen. No problems with his right arm.  In November 2017 patient had some type of left shoulder injury when he was reaching, felt something pop, and then had significant pain in his left shoulder region. He thought he might of injured his left rotator cuff.  No other preceding or prodromal injuries, accidents, infections, change in diet or activity.  Patient went to PCP, had evaluation, found to have B12 level slightly low at 221. Also had slight elevation in white blood cell count of 14.4. However no signs or symptoms of infection otherwise.    REVIEW OF SYSTEMS: Full 14 system review of systems performed and negative with exception of: Numbness weakness joint pain joint swelling aching muscles.   ALLERGIES: No Known Allergies  HOME MEDICATIONS: No outpatient prescriptions prior to visit.   No facility-administered medications prior to visit.     PAST  MEDICAL HISTORY: Past Medical History:  Diagnosis Date  . Scoliosis     PAST SURGICAL HISTORY: History reviewed. No pertinent surgical history.  FAMILY HISTORY: Family History  Problem Relation Age of Onset  . Ataxia Maternal Grandmother     SOCIAL HISTORY:  Social History   Social History  . Marital status: Married    Spouse name: Eileen StanfordJenna  . Number of children: 3  . Years of education: 12   Occupational History  .      [employer]   Social History Main Topics  . Smoking status: Current Every Day Smoker    Packs/day: 1.00  . Smokeless tobacco: Never Used  . Alcohol use Yes     Comment: weekend  . Drug use: No  . Sexual activity: Not on file   Other Topics Concern  . Not on file   Social History Narrative   Lives with family   Caffeine- coffee 2 cups daily     PHYSICAL EXAM  GENERAL EXAM/CONSTITUTIONAL: Vitals:  Vitals:   12/13/16 0832  BP: 125/83  Pulse: 64  Weight: 162 lb 3.2 oz (73.6 kg)  Height: 5\' 8"  (1.727 m)     Body mass index is 24.66 kg/m.  Visual Acuity Screening   Right eye Left eye Both eyes  Without correction: 20/20 20/20   With correction:        Patient is in no distress; well developed, nourished and groomed; neck is supple  CARDIOVASCULAR:  Examination of carotid arteries is normal; no carotid bruits  Regular rate and  rhythm, no murmurs  Examination of peripheral vascular system by observation and palpation is normal  EYES:  Ophthalmoscopic exam of optic discs and posterior segments is normal; no papilledema or hemorrhages  MUSCULOSKELETAL:  Gait, strength, tone, movements noted in Neurologic exam below  NEUROLOGIC: MENTAL STATUS:  No flowsheet data found.  awake, alert, oriented to person, place and time  recent and remote memory intact  normal attention and concentration  language fluent, comprehension intact, naming intact,   fund of knowledge appropriate  CRANIAL NERVE:   2nd - no papilledema on  fundoscopic exam  2nd, 3rd, 4th, 6th - pupils equal and reactive to light, visual fields full to confrontation, extraocular muscles intact, no nystagmus  5th - facial sensation symmetric  7th - facial strength symmetric  8th - hearing intact  9th - palate elevates symmetrically, uvula midline  11th - shoulder shrug symmetric  12th - tongue protrusion midline  MOTOR:   normal bulk and tone, full strength in the BUE, BLE EXCEPT RIGHT HIP FLEXION 4+, RIGHT KNEE FLEX 4+; LEFT HIP FLEXION 4, LEFT KNEE FLEX 4, LEFT FOOT DORSIFLEXION 4  DECR ROM IN LEFT SHOULDER  SENSORY:   normal and symmetric to light touch, pinprick, temperature  DECR VIB AT TOES (5 SEC)  COORDINATION:   finger-nose-finger, fine finger movements normal  REFLEXES:   deep tendon reflexes --> BUE 2, KNEES 3, ANKLES (1; 2 with upper ext reinforcement); DOWN GOING TOES; NEGATIVE HOFFMANS  GAIT/STATION:   UNSTEADY GAIT; DIFF WITH WALKING ON TOES; ROMBERG NEGATIVE     DIAGNOSTIC DATA (LABS, IMAGING, TESTING) - I reviewed patient records, labs, notes, testing and imaging myself where available.  No results found for: WBC, HGB, HCT, MCV, PLT No results found for: NA, K, CL, CO2, GLUCOSE, BUN, CREATININE, CALCIUM, PROT, ALBUMIN, AST, ALT, ALKPHOS, BILITOT, GFRNONAA, GFRAA No results found for: CHOL, HDL, LDLCALC, LDLDIRECT, TRIG, CHOLHDL No results found for: ONGE9B No results found for: VITAMINB12 No results found for: TSH   Outside labs (12/04/16)  A1c 5.3  B12 221  TSH 1.359  CBC: WBC 14.4 (N66, L27, M7, E1, B0)  CMP: Cr 1.1, Na 142    ASSESSMENT AND PLAN  46 y.o. year old male here with sudden onset lower extremity numbness and weakness in the bilateral feet and ankles 2 weeks ago, with saddle anesthesia one week ago, here for evaluation. Patient has lower extremity weakness, left worse than right, with hyperreflexia in the upper and lower extremities, and saddle anesthesia. These signs and  symptoms are concerning for lower thoracic spinal cord or conus medullaris pathology.   In addition patient has some problems with his left shoulder and slightly brisk reflexes in the upper extremities which could have a cervical spinal cord localization.   Localization: conus medullaris syndrome vs thoracic spinal cord compression  Ddx: spinal cord infarct, compression, infection, inflamm, neoplasm  1. Weakness of both lower extremities   2. Saddle anesthesia   3. Hyperreflexia   4. Other elevated white blood cell (WBC) count      PLAN:  - I ordered MRI thoracic and lumbar spine (w/wo) STAT studies to evaluate lower extremity numbness and weakness, saddle anesthesia; order with and without contrast to rule out infection / inflamm processes - if thoracic and lumbar spine are negative , then would check MRI cervical spine (w/wo)  - I have requested these studies to be done STAT at local imaging centers, but no available slots found; therefore I will refer patient to  ER to have MRI studies done at the hospital for definitive diagnosis and treatment; I spoke with [facility] physician and gave verbal handoff report by phone  - start gabapentin 300mg  BID for numbness and pain; rx sent in  Orders Placed This Encounter  Procedures  . MR CERVICAL SPINE W WO CONTRAST  . MR Lumbar Spine W Wo Contrast  . MR THORACIC SPINE W WO CONTRAST   Meds ordered this encounter  Medications  . gabapentin (NEURONTIN) 300 MG capsule    Sig: Take 1 capsule (300 mg total) by mouth 3 (three) times daily.    Dispense:  90 capsule    Refill:  6   Return in about 1 month (around 01/13/2017). or sooner pending MRI results  I reviewed images, labs, notes, records myself. I summarized findings and reviewed with patient, for this high risk condition (possible spinal cord compression or conus medullaris syndrome) requiring high complexity decision making.     Suanne Marker, MD 12/13/2016, 8:55  AM Certified in Neurology, Neurophysiology and Neuroimaging  [facility] [address], [address] [place], [place] [address] [phone]

## 2016-12-14 DIAGNOSIS — D519 Vitamin B12 deficiency anemia, unspecified: Secondary | ICD-10-CM | POA: Diagnosis not present

## 2016-12-14 DIAGNOSIS — R200 Anesthesia of skin: Secondary | ICD-10-CM

## 2016-12-14 DIAGNOSIS — M5106 Intervertebral disc disorders with myelopathy, lumbar region: Secondary | ICD-10-CM | POA: Diagnosis present

## 2016-12-14 DIAGNOSIS — Z79899 Other long term (current) drug therapy: Secondary | ICD-10-CM | POA: Diagnosis not present

## 2016-12-14 DIAGNOSIS — G959 Disease of spinal cord, unspecified: Secondary | ICD-10-CM | POA: Diagnosis not present

## 2016-12-14 DIAGNOSIS — E538 Deficiency of other specified B group vitamins: Secondary | ICD-10-CM | POA: Diagnosis present

## 2016-12-14 DIAGNOSIS — R269 Unspecified abnormalities of gait and mobility: Secondary | ICD-10-CM | POA: Diagnosis not present

## 2016-12-14 DIAGNOSIS — R29898 Other symptoms and signs involving the musculoskeletal system: Secondary | ICD-10-CM

## 2016-12-14 DIAGNOSIS — G373 Acute transverse myelitis in demyelinating disease of central nervous system: Secondary | ICD-10-CM | POA: Diagnosis present

## 2016-12-14 DIAGNOSIS — M5000 Cervical disc disorder with myelopathy, unspecified cervical region: Secondary | ICD-10-CM | POA: Diagnosis present

## 2016-12-14 DIAGNOSIS — E559 Vitamin D deficiency, unspecified: Secondary | ICD-10-CM | POA: Diagnosis present

## 2016-12-14 DIAGNOSIS — R202 Paresthesia of skin: Secondary | ICD-10-CM | POA: Diagnosis present

## 2016-12-14 DIAGNOSIS — F17210 Nicotine dependence, cigarettes, uncomplicated: Secondary | ICD-10-CM | POA: Diagnosis present

## 2016-12-14 DIAGNOSIS — M419 Scoliosis, unspecified: Secondary | ICD-10-CM | POA: Diagnosis present

## 2016-12-14 LAB — CSF CELL COUNT WITH DIFFERENTIAL
RBC COUNT CSF: 29 /mm3 — AB
TUBE #: 1
WBC, CSF: 1 /mm3 (ref 0–5)

## 2016-12-14 LAB — BASIC METABOLIC PANEL
ANION GAP: 5 (ref 5–15)
BUN: 9 mg/dL (ref 6–20)
CHLORIDE: 106 mmol/L (ref 101–111)
CO2: 28 mmol/L (ref 22–32)
Calcium: 8.9 mg/dL (ref 8.9–10.3)
Creatinine, Ser: 1.07 mg/dL (ref 0.61–1.24)
GFR calc Af Amer: >60 mL/min (ref >60)
GLUCOSE: 97 mg/dL (ref 65–99)
POTASSIUM: 3.9 mmol/L (ref 3.5–5.1)
SODIUM: 139 mmol/L (ref 135–145)

## 2016-12-14 LAB — TSH: TSH: 5.279 u[IU]/mL — ABNORMAL HIGH (ref 0.350–4.500)

## 2016-12-14 LAB — CBC
HEMATOCRIT: 45.4 % (ref 39.0–52.0)
HEMOGLOBIN: 15.8 g/dL (ref 13.0–17.0)
MCH: 31.8 pg (ref 26.0–34.0)
MCHC: 34.8 g/dL (ref 30.0–36.0)
MCV: 91.3 fL (ref 78.0–100.0)
Platelets: 244 10*3/uL (ref 150–400)
RBC: 4.97 MIL/uL (ref 4.22–5.81)
RDW: 12.8 % (ref 11.5–15.5)
WBC: 12.2 10*3/uL — AB (ref 4.0–10.5)

## 2016-12-14 LAB — GLUCOSE, CAPILLARY
GLUCOSE-CAPILLARY: 201 mg/dL — AB (ref 65–99)
GLUCOSE-CAPILLARY: 181 mg/dL — AB (ref 65–99)
Glucose-Capillary: 119 mg/dL — ABNORMAL HIGH (ref 65–99)
Glucose-Capillary: 151 mg/dL — ABNORMAL HIGH (ref 65–99)
Glucose-Capillary: 152 mg/dL — ABNORMAL HIGH (ref 65–99)

## 2016-12-14 LAB — VITAMIN B12

## 2016-12-14 LAB — SEDIMENTATION RATE: SED RATE: 0 mm/h (ref 0–16)

## 2016-12-14 LAB — ALBUMIN: Albumin: 4.2 g/dL (ref 3.5–5.0)

## 2016-12-14 LAB — C-REACTIVE PROTEIN: CRP: <0.8 mg/dL (ref <1.0)

## 2016-12-14 LAB — HIV ANTIBODY (ROUTINE TESTING W REFLEX): HIV Screen 4th Generation wRfx: NONREACTIVE

## 2016-12-14 LAB — MAGNESIUM: Magnesium: 2.1 mg/dL (ref 1.7–2.4)

## 2016-12-14 LAB — PROTEIN AND GLUCOSE, CSF
Glucose, CSF: 53 mg/dL (ref 40–70)
TOTAL PROTEIN, CSF: 18 mg/dL (ref 15–45)

## 2016-12-14 MED ORDER — LIDOCAINE HCL (PF) 1 % IJ SOLN
3.0000 mL | Freq: Once | INTRAMUSCULAR | Status: AC
  Administered 2016-12-14: 3 mL via INTRADERMAL

## 2016-12-14 MED ORDER — SODIUM CHLORIDE 0.9 % IV SOLN
1000.0000 mg | Freq: Every day | INTRAVENOUS | Status: AC
  Administered 2016-12-15 – 2016-12-18 (×4): 1000 mg via INTRAVENOUS
  Filled 2016-12-14 (×5): qty 8

## 2016-12-14 MED ORDER — SODIUM CHLORIDE 0.9 % IV SOLN
1000.0000 mg | Freq: Once | INTRAVENOUS | Status: AC
  Administered 2016-12-14: 1000 mg via INTRAVENOUS
  Filled 2016-12-14: qty 8

## 2016-12-14 MED ORDER — CYANOCOBALAMIN 1000 MCG/ML IJ SOLN
1000.0000 ug | Freq: Every day | INTRAMUSCULAR | Status: DC
  Administered 2016-12-14 – 2016-12-18 (×5): 1000 ug via INTRAMUSCULAR
  Filled 2016-12-14 (×6): qty 1

## 2016-12-14 NOTE — Progress Notes (Addendum)
Neurology Progress Note  Subjective: Currently the patient reports that he may of had some slight improvement in the tingling in his feet, though really hasn't noticed any significant changes in his symptoms. He has not had any new symptoms. He tolerated lumbar puncture this morning without any complications. He denies any headaches. He is tolerating steroids thus far without any difficulty. 10 point review of systems otherwise unremarkable.  Medications reviewed and reconciled.   Pertinent meds: Solu-Medrol 1000 mg daily Vitamin B12 1000 g IM daily Gabapentin 300 mg 3 times daily  Current Meds:   Current Facility-Administered Medications:  .  0.9 %  sodium chloride infusion, 250 mL, Intravenous, PRN, Tarry Kosavid, Rachal A, MD .  cyanocobalamin ((VITAMIN B-12)) injection 1,000 mcg, 1,000 mcg, Intramuscular, Daily, Versie Starksster, Adelyne Marchese James, MD, 1,000 mcg at 12/14/16 1030 .  gabapentin (NEURONTIN) capsule 300 mg, 300 mg, Oral, TID, Tarry Kosavid, Rachal A, MD, 300 mg at 12/14/16 1030 .  [START ON 12/15/2016] methylPREDNISolone sodium succinate (SOLU-MEDROL) 1,000 mg in sodium chloride 0.9 % 50 mL IVPB, 1,000 mg, Intravenous, Daily, Caryl PinaLindzen, Eric, MD .  sodium chloride flush (NS) 0.9 % injection 3 mL, 3 mL, Intravenous, Q12H, Tarry Kosavid, Rachal A, MD, 3 mL at 12/14/16 1031 .  sodium chloride flush (NS) 0.9 % injection 3 mL, 3 mL, Intravenous, PRN, Haydee Monicaavid, Rachal A, MD  Objective:  Temp:  [97.5 F (36.4 C)-98.8 F (37.1 C)] 98 F (36.7 C) (05/05 1426) Pulse Rate:  [51-66] 66 (05/05 1426) Resp:  [17-18] 17 (05/05 1426) BP: (120-138)/(73-99) 120/96 (05/05 1426) SpO2:  [96 %-99 %] 98 % (05/05 1426) Weight:  [73.7 kg (162 lb 7.7 oz)] 73.7 kg (162 lb 7.7 oz) (05/04 2242)  General: WDWN in NAD. Alert, oriented x4. Speech is clear without dysarthria. Affect is bright. Comportment is normal.  HEENT: Neck is supple without lymphadenopathy. Mucous membranes are moist and the oropharynx is clear. Sclerae are anicteric.  There is no conjunctival injection.  Neuro: MS: As noted above.  CN: Pupils are equal and reactive from 3-->2 mm bilaterally. EOMI, no nystagmus. Face is symmetric at rest with normal strength and mobility. Hearing is intact to conversational voice. Voice is normal in tone and quality. Bilateral SCM and trapezii are 5/5.  Motor: Normal bulk, tone, and strength throughout. No tremor or other abnormal movements are observed.  Sensation: Dysesthesias both feet below the ankles.  Labs: Lab Results  Component Value Date   WBC 12.2 (H) 12/14/2016   HGB 15.8 12/14/2016   HCT 45.4 12/14/2016   PLT 244 12/14/2016   GLUCOSE 97 12/14/2016   NA 139 12/14/2016   K 3.9 12/14/2016   CL 106 12/14/2016   CREATININE 1.07 12/14/2016   BUN 9 12/14/2016   CO2 28 12/14/2016   TSH 5.279 (H) 12/14/2016   CBC Latest Ref Rng & Units 12/14/2016 12/13/2016  WBC 4.0 - 10.5 K/uL 12.2(H) 13.6(H)  Hemoglobin 13.0 - 17.0 g/dL 28.415.8 13.216.8  Hematocrit 44.039.0 - 52.0 % 45.4 48.1  Platelets 150 - 400 K/uL 244 256   CSF wbc's 1 CSF RBCs 29 CSF protein 18 CSF glucose 53 CSF VDRL pending CSF oligoclonal bands pending CSF IgG index pending  Radiology:  I have personally and independently reviewed the MRI scan of the brain with and without contrast from 12/13/16. This shows minimal T2/FLAIR hyperintensity in the bihemispheric white matter. These are nonspecific in appearance and do not appear typical for demyelinating plaques. More likely they represent small vessel ischemic change but could also be  due to B12 deficiency, migraine, or remote trauma. Clinical correlation would be required. No abnormal enhancement is seen after contrast administration. Volumes are normal. Ventricular size and configuration normal.  I have personally and independently reviewed the MRI scan of the cervical spine with and without contrast from 12/13/16. This shows a mild degree of degenerative disease with mild foraminal stenosis noted at C6-7 on the  left. The cord itself appears to be unremarkable. No abnormal enhancement is seen.  I have personally and independently reviewed the MRI scan of the thoracic spine with and without contrast from 12/13/16. There is a focal area of T2 hyperintensity at T11. This appears to be central within the cord and his seen on the sagittal sequences. This is not clearly visible on the axial cuts which seemed to have crossed over the lesion. This is a small lesion measuring less than 1 cm in size. Faint enhancement occurs after administration of contrast.  I have personally and independently reviewed the MRI scan of the lumbar spine with and without contrast from 12/13/16. This shows minimal disc protrusion at L5-S1 possibly contacting the S1 nerve roots without compression.    A/P:   1. 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 The differential considerations are broad. The CSF does not show any evidence of active inflammation, arguing against infection or robust inflammatory processes. The absence of information the CSF may point more towards a metabolic cause or vascular cause but does not fully exclude autoimmune or inflammatory processes. The imaging appearance and presentation is atypical of spinal cord infarction and does not appear suggestive of dural AV fistula. MS remains a possibility that this would be somewhat unusual in a 46 year old male without any typical demyelinating lesions on his brain scan. Presentation and scans are not suggestive of neuromyelitis optica. Other potential considerations could include HIV infection, neurosyphilis, connective tissue disease, copper deficiency, zinc toxicity, vitamin D deficiency, and neurosarcoidosis. He did have a low normal vitamin B12 so this ranks high on the list of possibilities in this case. In reality, many cases of transverse myelitis are idiopathic in nature with no clear etiology identified despite  extensive workup. At this point, I will check HIV, RPR, ANA, anti-SSA and anti-SSB antibodies, TSH, zinc level, copper level, vitamin D level, vitamin E level, serum ACE level, and CRP. Await remaining CSF studies including VDRL, IgG index, and oligoclonal bands. Continue high-dose Solu-Medrol 1000 mg daily for a total of 5 days. Further treatment recommendations will depend upon results of the above workup.   2. Numbness: He has numbness and tingling in both feet as well as saddle anesthesia. This is due to his transverse myelitis. No obvious lesions are seen in the conus medullaris on MRI. He feels like his symptoms may be improved slightly today after his first dose of steroids. Continue steroids and monitor.  3. Bilateral lower extremity weakness: He has mild distal weakness in both lower extremities, essentially limited to weakness in his feet and ankles. PT/rehabilitation as needed. Continue steroids.  4. Abnormality of gait: He has had some gait changes related to his numbness and weakness in his feet. PT/rehabilitation as needed.  This was discussed with the patient and his wife. Education was provided on the diagnosis and expected evaluation and treatment. They are in agreement with the plan as noted. They were given the opportunity to ask any questions and these were addressed to their satisfaction.   Rhona Leavens, MD [facility]

## 2016-12-14 NOTE — Progress Notes (Signed)
Lumbar puncture procedure note:  No contraindications to LP after review of history and imaging. Risks/benefits of lumbar puncture were discussed with the patient who expressed understanding of risks, including nerve root damage, intrathecal bleeding, headache and infection. Informed consent was obtained verbally from patient and witnessed by Wyman SongsterJan Terry, RN.   The patient was prepped and draped in sterile fashion. Lumbar puncture site was sterilized with betadine x 3. Lidocaine 1% injected subcutaneously then into subcutaneous tissues. LP needle was inserted at the L4-L5 interspace and a total of 10 cc clear colorless CSF collected into 4 tubes. Tubes sent to the lab by nursing staff. The patient tolerated the procedure well. Instructed to lay supine for 2 hours.   Electronically signed: Dr. Caryl PinaEric Diesel Lina

## 2016-12-14 NOTE — Progress Notes (Signed)
PROGRESS NOTE        PATIENT DETAILS Name: Chad Webb Age: 46 y.o. Sex: male Date of Birth: May 16, 1971 Admit Date: 12/13/2016 Admitting Physician Haydee Monica, MD UJW:[id number], Karis Juba, PA-C  Brief Narrative: Patient is a 46 y.o. male with no significant past medical part from vitamin B12 deficiency (vitamin B12 level 221) history-referred from neurologists office for 2 week history of bilateral foot numbness with tingling, saddle anesthesia and tingling, and unsteady gait. MRI of the thoracic spine showed a enhancing lesion within the cord at T11 level-concerning for demyelinating lesion. Seen by neurology, underwent LP-and subsequently started on high-dose steroids. See below for further details  Subjective: Continues to have tingling and numbness along with saddle anesthesia. No major change since admission.  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 CSF with a normal protein levels-await oligoclonal bands, IgG levels in CSF. Await further recommendations from neurology.  Vitamin B12 deficiency: Check a methylmalonic acid and homocysteine levels-supplementation ongoing.  DVT Prophylaxis: SCD's-we will start pharmacologic prophylaxis tomorrow.  Code Status: Full code   Family Communication: Spouse at bedside  Disposition Plan: Remain inpatient-home when he completes course of pulse steroids  Antimicrobial agents: Anti-infectives    None     Procedures: LP 5/5>>  CONSULTS:  neurology  Time spent: 25 minutes-Greater than 50% of this time was spent in counseling, explanation of diagnosis, planning of further management, and coordination of care.  MEDICATIONS: Scheduled Meds: . cyanocobalamin  1,000 mcg Intramuscular Daily  . gabapentin  300 mg Oral TID  . sodium chloride flush  3  mL Intravenous Q12H   Continuous Infusions: . sodium chloride    . [START ON 12/15/2016] methylPREDNISolone (SOLU-MEDROL) injection     PRN Meds:.sodium chloride, sodium chloride flush   PHYSICAL EXAM: Webb signs: Vitals:   12/13/16 2216 12/13/16 2222 12/13/16 2242 12/14/16 0448  BP: (!) 138/99  128/77 121/73  Pulse: (!) 55  (!) 58 (!) 51  Resp: 18  18   Temp:  98.8 F (37.1 C) 97.5 F (36.4 C) 97.9 F (36.6 C)  TempSrc:  Oral Oral Oral  SpO2: 99%  98% 96%  Weight:   73.7 kg (162 lb 7.7 oz)   Height:   5\' 8"  (1.727 m)    Filed Weights   12/13/16 2242  Weight: 73.7 kg (162 lb 7.7 oz)   Body mass index is 24.7 kg/m.   General appearance :Awake, alert, not in any distress. Speech Clear. Not toxic Looking Eyes:, pupils equally reactive to light and accomodation,no scleral icterus.Pink conjunctiva HEENT: Atraumatic and Normocephalic Neck: supple, no JVD. No cervical lymphadenopathy. No thyromegaly Resp:Good air entry bilaterally, no added sounds  CVS: S1 S2 regular, no murmurs.  GI: Bowel sounds present, Non tender and not distended with no gaurding, rigidity or rebound.No organomegaly Extremities: B/L Lower Ext shows no edema Neurology:  speech clear,Non focal-with approximately 5/5 strength all over. Decreased sensation to light touch especially below bilateral ankles. Psychiatric: Normal judgment and insight. Alert and oriented x 3. Musculoskeletal:No digital cyanosis Skin:No Rash, warm and dry Wounds:N/A  I have personally reviewed following labs and imaging studies  LABORATORY DATA: CBC:  Recent Labs Lab 12/13/16 2308 12/14/16 0021  WBC 13.6* 12.2*  HGB 16.8 15.8  HCT 48.1 45.4  MCV 90.9 91.3  PLT 256 244    Basic Metabolic Panel:  Recent Labs Lab 12/13/16 1155 12/14/16 0021  NA 140 139  K 3.8 3.9  CL 107 106  CO2 25 28  GLUCOSE 95 97  BUN 10 9  CREATININE 1.03 1.07  CALCIUM 9.1 8.9  MG  --  2.1    GFR: Estimated Creatinine Clearance:  82.6 mL/min (by C-G formula based on SCr of 1.07 mg/dL).  Liver Function Tests:  Recent Labs Lab 12/14/16 0732  ALBUMIN 4.2   No results for input(s): LIPASE, AMYLASE in the last 168 hours. No results for input(s): AMMONIA in the last 168 hours.  Coagulation Profile: No results for input(s): INR, PROTIME in the last 168 hours.  Cardiac Enzymes: No results for input(s): CKTOTAL, CKMB, CKMBINDEX, TROPONINI in the last 168 hours.  BNP (last 3 results) No results for input(s): PROBNP in the last 8760 hours.  HbA1C: No results for input(s): HGBA1C in the last 72 hours.  CBG:  Recent Labs Lab 12/14/16 0642 12/14/16 0813  GLUCAP 119* 201*    Lipid Profile: No results for input(s): CHOL, HDL, LDLCALC, TRIG, CHOLHDL, LDLDIRECT in the last 72 hours.  Thyroid Function Tests:  Recent Labs  12/14/16 0021  TSH 5.279*    Anemia Panel: No results for input(s): VITAMINB12, FOLATE, FERRITIN, TIBC, IRON, RETICCTPCT in the last 72 hours.  Urine analysis: No results found for: COLORURINE, APPEARANCEUR, LABSPEC, PHURINE, GLUCOSEU, HGBUR, BILIRUBINUR, KETONESUR, PROTEINUR, UROBILINOGEN, NITRITE, LEUKOCYTESUR  Sepsis Labs: Lactic Acid, Venous No results found for: LATICACIDVEN  MICROBIOLOGY: No results found for this or any previous visit (from the past 240 hour(s)).  RADIOLOGY STUDIES/RESULTS: Mr Laqueta JeanBrain W And Wo Contrast  Result Date: 12/13/2016 CLINICAL DATA:  Myelopathy. EXAM: MRI HEAD WITHOUT AND WITH CONTRAST TECHNIQUE: Multiplanar, multiecho pulse sequences of the brain and surrounding structures were obtained without and with intravenous contrast. CONTRAST:  15mL MULTIHANCE GADOBENATE DIMEGLUMINE 529 MG/ML IV SOLN COMPARISON:  None. FINDINGS: Brain: 2 small FLAIR hyperintensities in the cerebral white matter. No specific demyelinating pattern. No signal abnormality along the third ventricle or cerebral aqua duct. No acute infarct, hemorrhage, hydrocephalus, or mass. Normal  brain volume. Vascular: Normal flow voids. Skull and upper cervical spine: Negative. Sinuses/Orbits: No evidence of optic neuritis. Small mucous retention cysts in the right sphenoid sinus. IMPRESSION: No explanation for myelopathy.  No demyelinating pattern. Electronically Signed   By: Marnee SpringJonathon  Watts M.D.   On: 12/13/2016 17:38   Mr Cervical Spine W Or Wo Contrast  Result Date: 12/13/2016 CLINICAL DATA:  Paresthesia on the plantar feet. Saddle anesthesia. Left shoulder sensation abnormality. EXAM: MRI CERVICAL, THORACIC AND LUMBAR SPINE WITHOUT CONTRAST TECHNIQUE: Multiplanar and multiecho pulse sequences of the cervical spine, to include the craniocervical junction and cervicothoracic junction, and thoracic and lumbar spine, were obtained without intravenous contrast. COMPARISON:  None. FINDINGS: MRI CERVICAL SPINE FINDINGS Alignment: Physiologic. Vertebrae: No fracture, evidence of discitis, or bone lesion. Cord: Normal signal and morphology. Posterior Fossa, vertebral arteries, paraspinal tissues: Negative. Disc levels: C2-3: Unremarkable. C3-4: Shallow disc protrusion and endplate ridging central to right paracentral. No impingement C4-5: Unremarkable. C5-6: Shallow rightward disc protrusion and minimal endplate ridging. Patent canal and foramina C6-7: Left foraminal protrusion or uncovertebral ridge with mild stenosis (axial T2 weighted imaging over estimates narrowing due to slice selection). Mild posterior endplate ridging and disc bulging. C7-T1:Unremarkable. MRI THORACIC SPINE FINDINGS Alignment: Exaggerated kyphosis. 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 Vertebrae: Multiple endplate irregularities. No acute fracture, discitis, or aggressive bone lesion. Cord: There is a discrete 8 mm intramedullary signal abnormality with subtle expansion and hazy faint enhancement. The lesion was skipped over on axial images but  appears to be central in the cord. Differential considerations: Demyelination- this would be a solitary lesion as there is no specific demyelinating pattern in the brain or elsewhere in the cord. This is a short segment lesion unlike transverse myelitis. Neoplasm- hemangioblastoma or ependymoma could have this appearance, hemangioma is usually more hypervascular and ependymoma is usually larger. This would be a small lesion for astrocytoma. Medullary metastases rare and there is no history of malignancy. Infection- short-segment, unlike transverse myelitis. No ring enhancement for infectious collection. Vascular- this would be an atypical pattern. Posttraumatic - no posttraumatic soft tissue/osseous findings. This is not at a disc level to suggest contusion. Inflammatory- neurosarcoid should be considered. Paraspinal and other soft tissues: Negative Disc levels: T5-6: Central disc herniation contacting the ventral cord with ventral cord flattening MRI LUMBAR SPINE FINDINGS Segmentation:  Standard. Alignment:  Physiologic. Vertebrae:  No fracture, evidence of discitis, or bone lesion. Conus medullaris: Extends to the L1 level and appears normal. Paraspinal and other soft tissues: Negative Disc levels: L5-S1: Disc protrusion eccentric to the right. This could contact the S1 nerve root but no mass effect. IMPRESSION: 1. 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 Please see above differential diagnosis in the thoracic spine "cord" header. 2. Overall mild degenerative changes in the cervical, thoracic, and lumbar spine which are described above. No high-grade stenosis. Electronically Signed   By: Marnee Spring M.D.   On: 12/13/2016 17:34   Mr Thoracic Spine W Wo Contrast  Result Date: 12/13/2016 CLINICAL DATA:  Paresthesia on the plantar feet. Saddle anesthesia. Left shoulder sensation abnormality. EXAM: MRI CERVICAL, THORACIC AND LUMBAR SPINE WITHOUT CONTRAST TECHNIQUE: Multiplanar and  multiecho pulse sequences of the cervical spine, to include the craniocervical junction and cervicothoracic junction, and thoracic and lumbar spine, were obtained without intravenous contrast. COMPARISON:  None. FINDINGS: MRI CERVICAL SPINE FINDINGS Alignment: Physiologic. Vertebrae: No fracture, evidence of discitis, or bone lesion. Cord: Normal signal and morphology. Posterior Fossa, vertebral arteries, paraspinal tissues: Negative. Disc levels: C2-3: Unremarkable. C3-4: Shallow disc protrusion and endplate ridging central to right paracentral. No impingement C4-5: Unremarkable. C5-6: Shallow rightward disc protrusion and minimal endplate ridging. Patent canal and foramina C6-7: Left foraminal protrusion or uncovertebral ridge with mild stenosis (axial T2 weighted imaging over estimates narrowing due to slice selection). Mild posterior endplate ridging and disc bulging. C7-T1:Unremarkable. MRI THORACIC SPINE FINDINGS Alignment: Exaggerated kyphosis. 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 Vertebrae: Multiple endplate irregularities. No acute fracture, discitis, or aggressive bone lesion. Cord: There is a discrete 8 mm intramedullary signal abnormality with subtle expansion and hazy faint enhancement. The lesion was skipped over on axial images but appears to be central in the cord. Differential considerations: Demyelination- this would be a solitary lesion as there is no specific demyelinating pattern in the brain or elsewhere in the cord. This is a short segment lesion unlike transverse myelitis. Neoplasm- hemangioblastoma or ependymoma could have this appearance, hemangioma is usually more hypervascular and ependymoma is usually larger. This would be a small lesion for astrocytoma. Medullary metastases rare and there is no history of malignancy. Infection- short-segment, unlike transverse myelitis. No ring enhancement for  infectious collection. Vascular- this would be an atypical  pattern. Posttraumatic - no posttraumatic soft tissue/osseous findings. This is not at a disc level to suggest contusion. Inflammatory- neurosarcoid should be considered. Paraspinal and other soft tissues: Negative Disc levels: T5-6: Central disc herniation contacting the ventral cord with ventral cord flattening MRI LUMBAR SPINE FINDINGS Segmentation:  Standard. Alignment:  Physiologic. Vertebrae:  No fracture, evidence of discitis, or bone lesion. Conus medullaris: Extends to the L1 level and appears normal. Paraspinal and other soft tissues: Negative Disc levels: L5-S1: Disc protrusion eccentric to the right. This could contact the S1 nerve root but no mass effect. IMPRESSION: 1. 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 Please see above differential diagnosis in the thoracic spine "cord" header. 2. Overall mild degenerative changes in the cervical, thoracic, and lumbar spine which are described above. No high-grade stenosis. Electronically Signed   By: Marnee Spring M.D.   On: 12/13/2016 17:34   Mr Lumbar Spine W Wo Contrast  Result Date: 12/13/2016 CLINICAL DATA:  Paresthesia on the plantar feet. Saddle anesthesia. Left shoulder sensation abnormality. EXAM: MRI CERVICAL, THORACIC AND LUMBAR SPINE WITHOUT CONTRAST TECHNIQUE: Multiplanar and multiecho pulse sequences of the cervical spine, to include the craniocervical junction and cervicothoracic junction, and thoracic and lumbar spine, were obtained without intravenous contrast. COMPARISON:  None. FINDINGS: MRI CERVICAL SPINE FINDINGS Alignment: Physiologic. Vertebrae: No fracture, evidence of discitis, or bone lesion. Cord: Normal signal and morphology. Posterior Fossa, vertebral arteries, paraspinal tissues: Negative. Disc levels: C2-3: Unremarkable. C3-4: Shallow disc protrusion and endplate ridging central to right paracentral. No impingement C4-5: Unremarkable. C5-6:  Shallow rightward disc protrusion and minimal endplate ridging. Patent canal and foramina C6-7: Left foraminal protrusion or uncovertebral ridge with mild stenosis (axial T2 weighted imaging over estimates narrowing due to slice selection). Mild posterior endplate ridging and disc bulging. C7-T1:Unremarkable. MRI THORACIC SPINE FINDINGS Alignment: Exaggerated kyphosis. 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 Vertebrae: Multiple endplate irregularities. No acute fracture, discitis, or aggressive bone lesion. Cord: There is a discrete 8 mm intramedullary signal abnormality with subtle expansion and hazy faint enhancement. The lesion was skipped over on axial images but appears to be central in the cord. Differential considerations: Demyelination- this would be a solitary lesion as there is no specific demyelinating pattern in the brain or elsewhere in the cord. This is a short segment lesion unlike transverse myelitis. Neoplasm- hemangioblastoma or ependymoma could have this appearance, hemangioma is usually more hypervascular and ependymoma is usually larger. This would be a small lesion for astrocytoma. Medullary metastases rare and there is no history of malignancy. Infection- short-segment, unlike transverse myelitis. No ring enhancement for infectious collection. Vascular- this would be an atypical pattern. Posttraumatic - no posttraumatic soft tissue/osseous findings. This is not at a disc level to suggest contusion. Inflammatory- neurosarcoid should be considered. Paraspinal and other soft tissues: Negative Disc levels: T5-6: Central disc herniation contacting the ventral cord with ventral cord flattening MRI LUMBAR SPINE FINDINGS Segmentation:  Standard. Alignment:  Physiologic. Vertebrae:  No fracture, evidence of discitis, or bone lesion. Conus medullaris: Extends to the L1 level and appears normal. Paraspinal and other  soft tissues: Negative Disc levels: L5-S1: Disc protrusion eccentric to the right. This could contact the S1 nerve root but no mass effect. IMPRESSION: 1. 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 Please see above differential diagnosis in the thoracic spine "cord" header. 2. Overall mild degenerative changes in the cervical, thoracic, and lumbar spine which are  described above. No high-grade stenosis. Electronically Signed   By: Marnee Spring M.D.   On: 12/13/2016 17:34     LOS: 0 days   Jeoffrey Massed, MD  [facility] Pager:[phone]  If 7PM-7AM, please contact night-coverage [email or web address] Password [email or web address] 12/14/2016, 11:20 AM

## 2016-12-15 LAB — GLUCOSE, CAPILLARY
GLUCOSE-CAPILLARY: 119 mg/dL — AB (ref 65–99)
Glucose-Capillary: 128 mg/dL — ABNORMAL HIGH (ref 65–99)

## 2016-12-15 NOTE — Progress Notes (Signed)
Neurology Progress Note  Subjective: He reports that he still has numbness and tingling in his feet but this has improved. He also notes improvement in his saddle anesthesia. He has been ambulating around the room and feels more steady on his feet. He denies any new symptoms. He continues to have no trouble with bowel or bladder function. He is tolerating the steroids without any ill effects. 12-point ROS otherwise negative.   Medications reviewed and reconciled.   Pertinent meds: Solumedrol 1000 mg daily (D# 2/5) Vitamin B12 1000 mcg IM Gabapentin 300 mg TID  Current Meds:   Current Facility-Administered Medications:  .  0.9 %  sodium chloride infusion, 250 mL, Intravenous, PRN, Tarry Kosavid, Rachal A, MD .  cyanocobalamin ((VITAMIN B-12)) injection 1,000 mcg, 1,000 mcg, Intramuscular, Daily, Versie Starksster, Evora Schechter James, MD, 1,000 mcg at 12/14/16 1030 .  gabapentin (NEURONTIN) capsule 300 mg, 300 mg, Oral, TID, Tarry Kosavid, Rachal A, MD, 300 mg at 12/14/16 2102 .  methylPREDNISolone sodium succinate (SOLU-MEDROL) 1,000 mg in sodium chloride 0.9 % 50 mL IVPB, 1,000 mg, Intravenous, Daily, Caryl PinaLindzen, Eric, MD .  sodium chloride flush (NS) 0.9 % injection 3 mL, 3 mL, Intravenous, Q12H, Tarry Kosavid, Rachal A, MD, 3 mL at 12/14/16 2102 .  sodium chloride flush (NS) 0.9 % injection 3 mL, 3 mL, Intravenous, PRN, Haydee Monicaavid, Rachal A, MD  Objective:  Temp:  [97 F (36.1 C)-98 F (36.7 C)] 97 F (36.1 C) (05/05 1953) Pulse Rate:  [66-68] 68 (05/05 1953) Resp:  [17] 17 (05/05 1426) BP: (119-120)/(83-96) 119/83 (05/05 1953) SpO2:  [97 %-98 %] 97 % (05/05 1953)  General: WDWN Caucasian man resting comfortably in bed in NAD. Alert, oriented x4. Speech is clear without dysarthria. Affect is bright. Comportment is normal.  HEENT: Neck is supple without lymphadenopathy. Mucous membranes are moist and the oropharynx is clear. Sclerae are anicteric. There is no conjunctival injection.  CV: Regular, no murmur. Carotid pulses are 2+  and symmetric with no bruits. Distal pulses 2+ and symmetric.  Lungs: CTAB  Extremities: No C/C/E. Neuro: MS: As noted above.  CN: Pupils are equal and reactive from 3-->2 mm bilaterally. EOMI, no nystagmus. Facial sensation is intact to light touch. Face is symmetric at rest with normal strength and mobility. Hearing is intact to conversational voice. Voice is normal in tone and quality. Palate elevates symmetrically. Uvula is midline. Bilateral SCM and trapezii are 5/5. Tongue is midline with normal bulk and mobility.  Motor: Normal bulk, tone, and strength throughout. No pronator drift. No tremor or other abnormal movements are observed.  Sensation: Intact to light touch. He reports mildly reduced pinprick in BLE below the knees. Vibration is absent in both feet and ankles, diminished at both knees. Joint position is mildly impaired at both great toes.  DTRs: 3+, symmetric. Toes are upgoing bilaterally.  Coordination: Finger-to-nose and heel-to-shin are without dysmetria bilaterally.    Labs: Lab Results  Component Value Date   WBC 12.2 (H) 12/14/2016   HGB 15.8 12/14/2016   HCT 45.4 12/14/2016   PLT 244 12/14/2016   GLUCOSE 97 12/14/2016   NA 139 12/14/2016   K 3.9 12/14/2016   CL 106 12/14/2016   CREATININE 1.07 12/14/2016   BUN 9 12/14/2016   CO2 28 12/14/2016   TSH 5.279 (H) 12/14/2016   CBC Latest Ref Rng & Units 12/14/2016 12/13/2016  WBC 4.0 - 10.5 K/uL 12.2(H) 13.6(H)  Hemoglobin 13.0 - 17.0 g/dL 16.115.8 09.616.8  Hematocrit 04.539.0 - 52.0 % 45.4 48.1  Platelets  150 - 400 K/uL 244 256   RPR nonreactive HIV nonreactive CRP <0.8  Pending labs:  Homocysteine MMA Anti-SSA/B antibodies ANA Serum copper Serum zinc Vitamin E  Vitamin D ACE Serum Lyme titers CSF IgG index CSF OCBs CSF Lyme titers CSF VDRL  Radiology:  There is no new neuroimaging for review.   A/P:   1. Transverse myelitis: Etiology remains uncertain. He has low-normal B12 which could produce  myelopathy. No evidence of systemic or intrathecal inflammation apart from mild peripheral leukocytosis. Waiting for labs as delineated above. He is improving symptomatically. Continue IV Solumedrol for total of five days. No need for taper after IV treatment is finished. Continue B12 replacement.   2. BLE numbness: This is improving. Continue steroids. PT as needed.   3. BLE weakness: This is due to transverse myelitis and has resolved on my exam. Follow.   4. Abnormality of gait: This is acute, due to transverse myelitis. This is getting better. PT as needed.   No family present at the time of my visit.   This was discussed with the patient. Education was provided on the diagnosis and expected evaluation and treatment. He is in agreement with the plan as noted. He was given the opportunity to ask any questions and these were addressed to his satisfaction.   I discussed the patient with his attending hospitalist, Dr. Jerral Ralph.   Rhona Leavens, MD [facility]

## 2016-12-15 NOTE — Progress Notes (Signed)
PROGRESS NOTE        PATIENT DETAILS Name: Chad Webb Age: 46 y.o. Sex: male Date of Birth: 30-Dec-1970 Admit Date: 12/13/2016 Admitting Physician Haydee Monica, MD ZOX:[id number], Karis Juba, PA-C  Brief Narrative: Patient is a 46 y.o. male with no significant past medical part from vitamin B12 deficiency (vitamin B12 level 221) history-referred from neurologists office for 2 week history of bilateral foot numbness with tingling, saddle anesthesia and tingling, and unsteady gait. MRI of the thoracic spine showed a enhancing lesion within the cord at T11 level-concerning for demyelinating lesion. Seen by neurology, underwent LP-and subsequently started on high-dose steroids. See below for further details  Subjective: Tingling, numbness and saddle anesthesia have improved. He is ambulating in the room independently. He wants to go home.  No chest pain, shortness of breath, abdominal pain, nausea, vomiting.   Assessment/Plan: T11 transverse myelitis: Symptoms improving, continue high-dose IV steroids 5 days. Explained to patient attended he will likely need to stay inpatient to complete a 5 day course of steroids. Subsequently spoke with neurologist-who recommend inpatient treatment with 5 days. HIV, RPR negative. Await IgG levels in CSF, CSF oligoclonal bands, and other serologic workup.  Vitamin B12 deficiency: Methylmalonic acid and homocystine levels pending-continue vitamin B12 supplementation.   DVT Prophylaxis: Start prophylactic Lovenox  Code Status: Full code   Family Communication: Spouse at bedside  Disposition Plan: Remain inpatient-home when he completes course of pulse steroids- likely Wednesday  Antimicrobial agents: Anti-infectives    None     Procedures: LP 5/5>>  CONSULTS:  neurology  Time spent: 25 minutes-Greater than 50% of this time was spent in counseling, explanation of diagnosis, planning of further management, and  coordination of care.  MEDICATIONS: Scheduled Meds: . cyanocobalamin  1,000 mcg Intramuscular Daily  . gabapentin  300 mg Oral TID  . sodium chloride flush  3 mL Intravenous Q12H   Continuous Infusions: . sodium chloride    . methylPREDNISolone (SOLU-MEDROL) injection Stopped (12/15/16 1135)   PRN Meds:.sodium chloride, sodium chloride flush   PHYSICAL EXAM: Vital signs: Vitals:   12/13/16 2242 12/14/16 0448 12/14/16 1426 12/14/16 1953  BP: 128/77 121/73 (!) 120/96 119/83  Pulse: (!) 58 (!) 51 66 68  Resp: 18  17   Temp: 97.5 F (36.4 C) 97.9 F (36.6 C) 98 F (36.7 C) 97 F (36.1 C)  TempSrc: Oral Oral Oral Oral  SpO2: 98% 96% 98% 97%  Weight: 73.7 kg (162 lb 7.7 oz)     Height: 5\' 8"  (1.727 m)      Filed Weights   12/13/16 2242  Weight: 73.7 kg (162 lb 7.7 oz)   Body mass index is 24.7 kg/m.   General appearance :Awake, alert, not in any distress.  Eyes:, pupils equally reactive to light and accomodation,no scleral icterus. HEENT: Atraumatic and Normocephalic Neck: supple, no JVD. Resp:Good air entry bilaterally,No rales or rhonchi CVS: S1 S2 regular, no murmurs.  GI: Bowel sounds present, Non tender and not distended with no gaurding, rigidity or rebound. Extremities: B/L Lower Ext shows no edema, both legs are warm to touch Neurology: Strength 5/5 today-slight improvement in sensation-specialty below the ankles, some improvement in proprioception. Psychiatric: Normal judgment and insight. Normal mood. Musculoskeletal:No digital cyanosis Skin:No Rash, warm and dry Wounds:N/A  I have personally reviewed following labs and imaging studies  LABORATORY DATA: CBC:  Recent  Labs Lab 12/13/16 2308 12/14/16 0021  WBC 13.6* 12.2*  HGB 16.8 15.8  HCT 48.1 45.4  MCV 90.9 91.3  PLT 256 244    Basic Metabolic Panel:  Recent Labs Lab 12/13/16 1155 12/14/16 0021  NA 140 139  K 3.8 3.9  CL 107 106  CO2 25 28  GLUCOSE 95 97  BUN 10 9  CREATININE 1.03  1.07  CALCIUM 9.1 8.9  MG  --  2.1    GFR: Estimated Creatinine Clearance: 82.6 mL/min (by C-G formula based on SCr of 1.07 mg/dL).  Liver Function Tests:  Recent Labs Lab 12/14/16 0732  ALBUMIN 4.2   No results for input(s): LIPASE, AMYLASE in the last 168 hours. No results for input(s): AMMONIA in the last 168 hours.  Coagulation Profile: No results for input(s): INR, PROTIME in the last 168 hours.  Cardiac Enzymes: No results for input(s): CKTOTAL, CKMB, CKMBINDEX, TROPONINI in the last 168 hours.  BNP (last 3 results) No results for input(s): PROBNP in the last 8760 hours.  HbA1C: No results for input(s): HGBA1C in the last 72 hours.  CBG:  Recent Labs Lab 12/14/16 1143 12/14/16 1752 12/14/16 2359 12/15/16 0645 12/15/16 1147  GLUCAP 151* 181* 152* 128* 119*    Lipid Profile: No results for input(s): CHOL, HDL, LDLCALC, TRIG, CHOLHDL, LDLDIRECT in the last 72 hours.  Thyroid Function Tests:  Recent Labs  12/14/16 0021  TSH 5.279*    Anemia Panel:  Recent Labs  12/14/16 1310  VITAMINB12 >7,500*    Urine analysis: No results found for: COLORURINE, APPEARANCEUR, LABSPEC, PHURINE, GLUCOSEU, HGBUR, BILIRUBINUR, KETONESUR, PROTEINUR, UROBILINOGEN, NITRITE, LEUKOCYTESUR  Sepsis Labs: Lactic Acid, Venous No results found for: LATICACIDVEN  MICROBIOLOGY: No results found for this or any previous visit (from the past 240 hour(s)).  RADIOLOGY STUDIES/RESULTS: Mr Laqueta Jean And Wo Contrast  Result Date: 12/13/2016 CLINICAL DATA:  Myelopathy. EXAM: MRI HEAD WITHOUT AND WITH CONTRAST TECHNIQUE: Multiplanar, multiecho pulse sequences of the brain and surrounding structures were obtained without and with intravenous contrast. CONTRAST:  15mL MULTIHANCE GADOBENATE DIMEGLUMINE 529 MG/ML IV SOLN COMPARISON:  None. FINDINGS: Brain: 2 small FLAIR hyperintensities in the cerebral white matter. No specific demyelinating pattern. No signal abnormality along the third  ventricle or cerebral aqua duct. No acute infarct, hemorrhage, hydrocephalus, or mass. Normal brain volume. Vascular: Normal flow voids. Skull and upper cervical spine: Negative. Sinuses/Orbits: No evidence of optic neuritis. Small mucous retention cysts in the right sphenoid sinus. IMPRESSION: No explanation for myelopathy.  No demyelinating pattern. Electronically Signed   By: Marnee Spring M.D.   On: 12/13/2016 17:38   Mr Cervical Spine W Or Wo Contrast  Result Date: 12/13/2016 CLINICAL DATA:  Paresthesia on the plantar feet. Saddle anesthesia. Left shoulder sensation abnormality. EXAM: MRI CERVICAL, THORACIC AND LUMBAR SPINE WITHOUT CONTRAST TECHNIQUE: Multiplanar and multiecho pulse sequences of the cervical spine, to include the craniocervical junction and cervicothoracic junction, and thoracic and lumbar spine, were obtained without intravenous contrast. COMPARISON:  None. FINDINGS: MRI CERVICAL SPINE FINDINGS Alignment: Physiologic. Vertebrae: No fracture, evidence of discitis, or bone lesion. Cord: Normal signal and morphology. Posterior Fossa, vertebral arteries, paraspinal tissues: Negative. Disc levels: C2-3: Unremarkable. C3-4: Shallow disc protrusion and endplate ridging central to right paracentral. No impingement C4-5: Unremarkable. C5-6: Shallow rightward disc protrusion and minimal endplate ridging. Patent canal and foramina C6-7: Left foraminal protrusion or uncovertebral ridge with mild stenosis (axial T2 weighted imaging over estimates narrowing due to slice selection). Mild posterior endplate ridging  and disc bulging. C7-T1:Unremarkable. MRI THORACIC SPINE FINDINGS Alignment: Exaggerated kyphosis. 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 Vertebrae: Multiple endplate irregularities. No acute fracture, discitis, or aggressive bone lesion. Cord: There is a discrete 8 mm intramedullary signal abnormality  with subtle expansion and hazy faint enhancement. The lesion was skipped over on axial images but appears to be central in the cord. Differential considerations: Demyelination- this would be a solitary lesion as there is no specific demyelinating pattern in the brain or elsewhere in the cord. This is a short segment lesion unlike transverse myelitis. Neoplasm- hemangioblastoma or ependymoma could have this appearance, hemangioma is usually more hypervascular and ependymoma is usually larger. This would be a small lesion for astrocytoma. Medullary metastases rare and there is no history of malignancy. Infection- short-segment, unlike transverse myelitis. No ring enhancement for infectious collection. Vascular- this would be an atypical pattern. Posttraumatic - no posttraumatic soft tissue/osseous findings. This is not at a disc level to suggest contusion. Inflammatory- neurosarcoid should be considered. Paraspinal and other soft tissues: Negative Disc levels: T5-6: Central disc herniation contacting the ventral cord with ventral cord flattening MRI LUMBAR SPINE FINDINGS Segmentation:  Standard. Alignment:  Physiologic. Vertebrae:  No fracture, evidence of discitis, or bone lesion. Conus medullaris: Extends to the L1 level and appears normal. Paraspinal and other soft tissues: Negative Disc levels: L5-S1: Disc protrusion eccentric to the right. This could contact the S1 nerve root but no mass effect. IMPRESSION: 1. 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 Please see above differential diagnosis in the thoracic spine "cord" header. 2. Overall mild degenerative changes in the cervical, thoracic, and lumbar spine which are described above. No high-grade stenosis. Electronically Signed   By: Marnee SpringJonathon  Watts M.D.   On: 12/13/2016 17:34   Mr Thoracic Spine W Wo Contrast  Result Date: 12/13/2016 CLINICAL DATA:  Paresthesia on the plantar feet. Saddle anesthesia. Left shoulder sensation abnormality.  EXAM: MRI CERVICAL, THORACIC AND LUMBAR SPINE WITHOUT CONTRAST TECHNIQUE: Multiplanar and multiecho pulse sequences of the cervical spine, to include the craniocervical junction and cervicothoracic junction, and thoracic and lumbar spine, were obtained without intravenous contrast. COMPARISON:  None. FINDINGS: MRI CERVICAL SPINE FINDINGS Alignment: Physiologic. Vertebrae: No fracture, evidence of discitis, or bone lesion. Cord: Normal signal and morphology. Posterior Fossa, vertebral arteries, paraspinal tissues: Negative. Disc levels: C2-3: Unremarkable. C3-4: Shallow disc protrusion and endplate ridging central to right paracentral. No impingement C4-5: Unremarkable. C5-6: Shallow rightward disc protrusion and minimal endplate ridging. Patent canal and foramina C6-7: Left foraminal protrusion or uncovertebral ridge with mild stenosis (axial T2 weighted imaging over estimates narrowing due to slice selection). Mild posterior endplate ridging and disc bulging. C7-T1:Unremarkable. MRI THORACIC SPINE FINDINGS Alignment: Exaggerated kyphosis. 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 Vertebrae: Multiple endplate irregularities. No acute fracture, discitis, or aggressive bone lesion. Cord: There is a discrete 8 mm intramedullary signal abnormality with subtle expansion and hazy faint enhancement. The lesion was skipped over on axial images but appears to be central in the cord. Differential considerations: Demyelination- this would be a solitary lesion as there is no specific demyelinating pattern in the brain or elsewhere in the cord. This is a short segment lesion unlike transverse myelitis. Neoplasm- hemangioblastoma or ependymoma could have this appearance, hemangioma is usually more hypervascular and ependymoma is usually larger. This would be a small lesion for astrocytoma. Medullary metastases rare and  there is no history of  malignancy. Infection- short-segment, unlike transverse myelitis. No ring enhancement for infectious collection. Vascular- this would be an atypical pattern. Posttraumatic - no posttraumatic soft tissue/osseous findings. This is not at a disc level to suggest contusion. Inflammatory- neurosarcoid should be considered. Paraspinal and other soft tissues: Negative Disc levels: T5-6: Central disc herniation contacting the ventral cord with ventral cord flattening MRI LUMBAR SPINE FINDINGS Segmentation:  Standard. Alignment:  Physiologic. Vertebrae:  No fracture, evidence of discitis, or bone lesion. Conus medullaris: Extends to the L1 level and appears normal. Paraspinal and other soft tissues: Negative Disc levels: L5-S1: Disc protrusion eccentric to the right. This could contact the S1 nerve root but no mass effect. IMPRESSION: 1. 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 Please see above differential diagnosis in the thoracic spine "cord" header. 2. Overall mild degenerative changes in the cervical, thoracic, and lumbar spine which are described above. No high-grade stenosis. Electronically Signed   By: Marnee Spring M.D.   On: 12/13/2016 17:34   Mr Lumbar Spine W Wo Contrast  Result Date: 12/13/2016 CLINICAL DATA:  Paresthesia on the plantar feet. Saddle anesthesia. Left shoulder sensation abnormality. EXAM: MRI CERVICAL, THORACIC AND LUMBAR SPINE WITHOUT CONTRAST TECHNIQUE: Multiplanar and multiecho pulse sequences of the cervical spine, to include the craniocervical junction and cervicothoracic junction, and thoracic and lumbar spine, were obtained without intravenous contrast. COMPARISON:  None. FINDINGS: MRI CERVICAL SPINE FINDINGS Alignment: Physiologic. Vertebrae: No fracture, evidence of discitis, or bone lesion. Cord: Normal signal and morphology. Posterior Fossa, vertebral arteries, paraspinal tissues: Negative. Disc levels: C2-3: Unremarkable. C3-4: Shallow disc protrusion and  endplate ridging central to right paracentral. No impingement C4-5: Unremarkable. C5-6: Shallow rightward disc protrusion and minimal endplate ridging. Patent canal and foramina C6-7: Left foraminal protrusion or uncovertebral ridge with mild stenosis (axial T2 weighted imaging over estimates narrowing due to slice selection). Mild posterior endplate ridging and disc bulging. C7-T1:Unremarkable. MRI THORACIC SPINE FINDINGS Alignment: Exaggerated kyphosis. 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 Vertebrae: Multiple endplate irregularities. No acute fracture, discitis, or aggressive bone lesion. Cord: There is a discrete 8 mm intramedullary signal abnormality with subtle expansion and hazy faint enhancement. The lesion was skipped over on axial images but appears to be central in the cord. Differential considerations: Demyelination- this would be a solitary lesion as there is no specific demyelinating pattern in the brain or elsewhere in the cord. This is a short segment lesion unlike transverse myelitis. Neoplasm- hemangioblastoma or ependymoma could have this appearance, hemangioma is usually more hypervascular and ependymoma is usually larger. This would be a small lesion for astrocytoma. Medullary metastases rare and there is no history of malignancy. Infection- short-segment, unlike transverse myelitis. No ring enhancement for infectious collection. Vascular- this would be an atypical pattern. Posttraumatic - no posttraumatic soft tissue/osseous findings. This is not at a disc level to suggest contusion. Inflammatory- neurosarcoid should be considered. Paraspinal and other soft tissues: Negative Disc levels: T5-6: Central disc herniation contacting the ventral cord with ventral cord flattening MRI LUMBAR SPINE FINDINGS Segmentation:  Standard. Alignment:  Physiologic. Vertebrae:  No fracture, evidence of discitis, or bone  lesion. Conus medullaris: Extends to the L1 level and appears normal. Paraspinal and other soft tissues: Negative Disc levels: L5-S1: Disc protrusion eccentric to the right. This could contact the S1 nerve root but no mass effect. IMPRESSION: 1. 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 Please  see above differential diagnosis in the thoracic spine "cord" header. 2. Overall mild degenerative changes in the cervical, thoracic, and lumbar spine which are described above. No high-grade stenosis. Electronically Signed   By: Marnee Spring M.D.   On: 12/13/2016 17:34     LOS: 1 day   Jeoffrey Massed, MD  [facility] Pager:[phone]  If 7PM-7AM, please contact night-coverage [email or web address] Password [email or web address] 12/15/2016, 2:20 PM

## 2016-12-16 LAB — IGG CSF INDEX
ALBUMIN CSF-MCNC: 9 mg/dL — AB (ref 11–48)
Albumin: 4.6 g/dL (ref 3.5–5.5)
CSF IgG Index: 1.1 — ABNORMAL HIGH (ref 0.0–0.7)
IGG CSF: 1.5 mg/dL (ref 0.0–8.6)
IgG (Immunoglobin G), Serum: 674 mg/dL — ABNORMAL LOW (ref 700–1600)
IgG/Alb Ratio, CSF: 0.17 (ref 0.00–0.25)

## 2016-12-16 LAB — FOLATE RBC
FOLATE, RBC: 671 ng/mL (ref >498)
Folate, Hemolysate: 325.9 ng/mL
HEMATOCRIT: 48.6 % (ref 37.5–51.0)

## 2016-12-16 LAB — HOMOCYSTEINE: HOMOCYSTEINE-NORM: 6.9 umol/L (ref 0.0–15.0)

## 2016-12-16 LAB — VDRL, CSF: SYPHILIS VDRL QUANT CSF: NONREACTIVE

## 2016-12-16 LAB — GLUCOSE, CAPILLARY
Glucose-Capillary: 98 mg/dL (ref 65–99)
Glucose-Capillary: 165 mg/dL — ABNORMAL HIGH (ref 65–99)

## 2016-12-16 LAB — B. BURGDORFI ANTIBODIES

## 2016-12-16 NOTE — Progress Notes (Addendum)
Patient was seen and examined, he continues to improve. Tingling in his lower extremities has resolved, he just has minimal numbness mostly on the plantar aspect of his bilateral toes. Saddle anesthesia has resolved.  Vitals: Blood pressure 130/83, pulse 63  Neuro exam: Nonfocal  Impression: Transverse myelitis-improving  Plan: Continue IV Solu-Medrol-stop date 5/9, await further recommendations from neurology. CSF studies and serological studies are still pending.  Time spent 15 minutes

## 2016-12-17 DIAGNOSIS — G959 Disease of spinal cord, unspecified: Secondary | ICD-10-CM

## 2016-12-17 DIAGNOSIS — E559 Vitamin D deficiency, unspecified: Secondary | ICD-10-CM

## 2016-12-17 LAB — GLUCOSE, CAPILLARY
GLUCOSE-CAPILLARY: 126 mg/dL — AB (ref 65–99)
GLUCOSE-CAPILLARY: 159 mg/dL — AB (ref 65–99)
Glucose-Capillary: 119 mg/dL — ABNORMAL HIGH (ref 65–99)
Glucose-Capillary: 120 mg/dL — ABNORMAL HIGH (ref 65–99)
Glucose-Capillary: 99 mg/dL (ref 65–99)

## 2016-12-17 LAB — METHYLMALONIC ACID, SERUM: Methylmalonic Acid, Quantitative: 118 nmol/L (ref 0–378)

## 2016-12-17 MED ORDER — CHOLECALCIFEROL 10 MCG (400 UNIT) PO TABS
400.0000 [IU] | ORAL_TABLET | Freq: Every day | ORAL | Status: DC
  Administered 2016-12-17 – 2016-12-18 (×2): 400 [IU] via ORAL
  Filled 2016-12-17 (×3): qty 1

## 2016-12-17 NOTE — Progress Notes (Signed)
PROGRESS NOTE        PATIENT DETAILS Name: Chad Webb Age: 46 y.o. Sex: male Date of Birth: [birth year] Admit Date: 12/13/2016 Admitting Physician Haydee Monica, MD ONG:[id number], Karis Juba, PA-C  Brief Narrative: Patient is a 46 y.o. male with no significant past medical part from vitamin B12 deficiency (vitamin B12 level 221 as outpatient) history-admitted with bilateral foot numbness/tingling/saddle anesthesia. MRI of the thoracic spine showed a enhancing lesion at T11. Thought to have transverse myelitis, admitted and started on high-dose steroids. See below for further details  Subjective: Significantly improved-minimal numbness on the plantar surface of bilateral foot. No longer having saddle anesthesia.   Assessment/Plan: T11 transverse myelitis: Symptoms improving, continue high-dose IV steroids 5 days-stop date on 5/9. Per neurology, patient does not require tapering steroids on discharge. Autoimmune panel negative, HIV antibody and RPR negative. ACE negative. Awaiting CSF oligoclonal bands.  Vitamin B12 deficiency: Methylmalonic acid and homocystine levels are within normal limits, continue vitamin B12 supplementation.  Vitamin D deficiency:begin supplementation  DVT Prophylaxis: Start prophylactic Lovenox  Code Status: Full code   Family Communication: None at bedside  Disposition Plan: Remain inpatient-home tomorrow  Antimicrobial agents: Anti-infectives    None     Procedures: LP 5/5>>  CONSULTS:  neurology  Time spent: 25 minutes-Greater than 50% of this time was spent in counseling, explanation of diagnosis, planning of further management, and coordination of care.  MEDICATIONS: Scheduled Meds: . cyanocobalamin  1,000 mcg Intramuscular Daily  . gabapentin  300 mg Oral TID  . sodium chloride flush  3 mL Intravenous Q12H   Continuous Infusions: . sodium chloride    . methylPREDNISolone (SOLU-MEDROL) injection 1,000 mg  (12/17/16 1158)   PRN Meds:.sodium chloride, sodium chloride flush   PHYSICAL EXAM: Vital signs: Vitals:   12/16/16 0500 12/16/16 1614 12/16/16 2124 12/17/16 0603  BP: 130/83 126/76 125/76 119/83  Pulse: 63 60 (!) 50 66  Resp: 18  18 18   Temp: 97.9 F (36.6 C) 98.1 F (36.7 C) 99 F (37.2 C) 97.9 F (36.6 C)  TempSrc: Oral Oral Oral Oral  SpO2: 98% 97% 98% 98%  Weight:      Height:       Filed Weights   12/13/16 2242  Weight: 73.7 kg (162 lb 7.7 oz)   Body mass index is 24.7 kg/m.   General appearance :Awake, alert, not in any distress.  Eyes:, pupils equally reactive to light and accomodation,no scleral icterus. HEENT: Atraumatic and Normocephalic Neck: supple, no JVD. Resp:Good air entry bilaterally CVS: S1 S2 regular, no murmurs.  GI: Bowel sounds present, Non tender and not distended with no gaurding, rigidity or rebound. Extremities: B/L Lower Ext shows no edema, both legs are warm to touch Neurology:  speech clear,Non focal, sensation is grossly intact. Psychiatric: Normal judgment and insight. Normal mood. Musculoskeletal:No digital cyanosis Skin:No Rash, warm and dry Wounds:N/A  I have personally reviewed following labs and imaging studies  LABORATORY DATA: CBC:  Recent Labs Lab 12/13/16 2308 12/14/16 0021 12/14/16 1310  WBC 13.6* 12.2*  --   HGB 16.8 15.8  --   HCT 48.1 45.4 48.6  MCV 90.9 91.3  --   PLT 256 244  --     Basic Metabolic Panel:  Recent Labs Lab 12/13/16 1155 12/14/16 0021  NA 140 139  K 3.8 3.9  CL 107 106  CO2 25 28  GLUCOSE 95 97  BUN 10 9  CREATININE 1.03 1.07  CALCIUM 9.1 8.9  MG  --  2.1    GFR: Estimated Creatinine Clearance: 82.6 mL/min (by C-G formula based on SCr of 1.07 mg/dL).  Liver Function Tests:  Recent Labs Lab 12/14/16 0436 12/14/16 0732  ALBUMIN 4.6 4.2   No results for input(s): LIPASE, AMYLASE in the last 168 hours. No results for input(s): AMMONIA in the last 168 hours.  Coagulation  Profile: No results for input(s): INR, PROTIME in the last 168 hours.  Cardiac Enzymes: No results for input(s): CKTOTAL, CKMB, CKMBINDEX, TROPONINI in the last 168 hours.  BNP (last 3 results) No results for input(s): PROBNP in the last 8760 hours.  HbA1C: No results for input(s): HGBA1C in the last 72 hours.  CBG:  Recent Labs Lab 12/16/16 1147 12/16/16 1728 12/17/16 0011 12/17/16 0600 12/17/16 1202  GLUCAP 98 165* 120* 99 126*    Lipid Profile: No results for input(s): CHOL, HDL, LDLCALC, TRIG, CHOLHDL, LDLDIRECT in the last 72 hours.  Thyroid Function Tests: No results for input(s): TSH, T4TOTAL, FREET4, T3FREE, THYROIDAB in the last 72 hours.  Anemia Panel: No results for input(s): VITAMINB12, FOLATE, FERRITIN, TIBC, IRON, RETICCTPCT in the last 72 hours.  Urine analysis: No results found for: COLORURINE, APPEARANCEUR, LABSPEC, PHURINE, GLUCOSEU, HGBUR, BILIRUBINUR, KETONESUR, PROTEINUR, UROBILINOGEN, NITRITE, LEUKOCYTESUR  Sepsis Labs: Lactic Acid, Venous No results found for: LATICACIDVEN  MICROBIOLOGY: No results found for this or any previous visit (from the past 240 hour(s)).  RADIOLOGY STUDIES/RESULTS: Mr Laqueta Jean And Wo Contrast  Result Date: 12/13/2016 CLINICAL DATA:  Myelopathy. EXAM: MRI HEAD WITHOUT AND WITH CONTRAST TECHNIQUE: Multiplanar, multiecho pulse sequences of the brain and surrounding structures were obtained without and with intravenous contrast. CONTRAST:  15mL MULTIHANCE GADOBENATE DIMEGLUMINE 529 MG/ML IV SOLN COMPARISON:  None. FINDINGS: Brain: 2 small FLAIR hyperintensities in the cerebral white matter. No specific demyelinating pattern. No signal abnormality along the third ventricle or cerebral aqua duct. No acute infarct, hemorrhage, hydrocephalus, or mass. Normal brain volume. Vascular: Normal flow voids. Skull and upper cervical spine: Negative. Sinuses/Orbits: No evidence of optic neuritis. Small mucous retention cysts in the right  sphenoid sinus. IMPRESSION: No explanation for myelopathy.  No demyelinating pattern. Electronically Signed   By: Marnee Spring M.D.   On: 12/13/2016 17:38   Mr Cervical Spine W Or Wo Contrast  Result Date: 12/13/2016 CLINICAL DATA:  Paresthesia on the plantar feet. Saddle anesthesia. Left shoulder sensation abnormality. EXAM: MRI CERVICAL, THORACIC AND LUMBAR SPINE WITHOUT CONTRAST TECHNIQUE: Multiplanar and multiecho pulse sequences of the cervical spine, to include the craniocervical junction and cervicothoracic junction, and thoracic and lumbar spine, were obtained without intravenous contrast. COMPARISON:  None. FINDINGS: MRI CERVICAL SPINE FINDINGS Alignment: Physiologic. Vertebrae: No fracture, evidence of discitis, or bone lesion. Cord: Normal signal and morphology. Posterior Fossa, vertebral arteries, paraspinal tissues: Negative. Disc levels: C2-3: Unremarkable. C3-4: Shallow disc protrusion and endplate ridging central to right paracentral. No impingement C4-5: Unremarkable. C5-6: Shallow rightward disc protrusion and minimal endplate ridging. Patent canal and foramina C6-7: Left foraminal protrusion or uncovertebral ridge with mild stenosis (axial T2 weighted imaging over estimates narrowing due to slice selection). Mild posterior endplate ridging and disc bulging. C7-T1:Unremarkable. MRI THORACIC SPINE FINDINGS Alignment: Exaggerated kyphosis. 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 Vertebrae: Multiple endplate irregularities. No acute fracture, discitis, or aggressive bone lesion. Cord: There is a  discrete 8 mm intramedullary signal abnormality with subtle expansion and hazy faint enhancement. The lesion was skipped over on axial images but appears to be central in the cord. Differential considerations: Demyelination- this would be a solitary lesion as there is no specific demyelinating pattern in the brain or  elsewhere in the cord. This is a short segment lesion unlike transverse myelitis. Neoplasm- hemangioblastoma or ependymoma could have this appearance, hemangioma is usually more hypervascular and ependymoma is usually larger. This would be a small lesion for astrocytoma. Medullary metastases rare and there is no history of malignancy. Infection- short-segment, unlike transverse myelitis. No ring enhancement for infectious collection. Vascular- this would be an atypical pattern. Posttraumatic - no posttraumatic soft tissue/osseous findings. This is not at a disc level to suggest contusion. Inflammatory- neurosarcoid should be considered. Paraspinal and other soft tissues: Negative Disc levels: T5-6: Central disc herniation contacting the ventral cord with ventral cord flattening MRI LUMBAR SPINE FINDINGS Segmentation:  Standard. Alignment:  Physiologic. Vertebrae:  No fracture, evidence of discitis, or bone lesion. Conus medullaris: Extends to the L1 level and appears normal. Paraspinal and other soft tissues: Negative Disc levels: L5-S1: Disc protrusion eccentric to the right. This could contact the S1 nerve root but no mass effect. IMPRESSION: 1. 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 Please see above differential diagnosis in the thoracic spine "cord" header. 2. Overall mild degenerative changes in the cervical, thoracic, and lumbar spine which are described above. No high-grade stenosis. Electronically Signed   By: Marnee SpringJonathon  Watts M.D.   On: 12/13/2016 17:34   Mr Thoracic Spine W Wo Contrast  Result Date: 12/13/2016 CLINICAL DATA:  Paresthesia on the plantar feet. Saddle anesthesia. Left shoulder sensation abnormality. EXAM: MRI CERVICAL, THORACIC AND LUMBAR SPINE WITHOUT CONTRAST TECHNIQUE: Multiplanar and multiecho pulse sequences of the cervical spine, to include the craniocervical junction and cervicothoracic junction, and thoracic and lumbar spine, were obtained without  intravenous contrast. COMPARISON:  None. FINDINGS: MRI CERVICAL SPINE FINDINGS Alignment: Physiologic. Vertebrae: No fracture, evidence of discitis, or bone lesion. Cord: Normal signal and morphology. Posterior Fossa, vertebral arteries, paraspinal tissues: Negative. Disc levels: C2-3: Unremarkable. C3-4: Shallow disc protrusion and endplate ridging central to right paracentral. No impingement C4-5: Unremarkable. C5-6: Shallow rightward disc protrusion and minimal endplate ridging. Patent canal and foramina C6-7: Left foraminal protrusion or uncovertebral ridge with mild stenosis (axial T2 weighted imaging over estimates narrowing due to slice selection). Mild posterior endplate ridging and disc bulging. C7-T1:Unremarkable. MRI THORACIC SPINE FINDINGS Alignment: Exaggerated kyphosis. 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 Vertebrae: Multiple endplate irregularities. No acute fracture, discitis, or aggressive bone lesion. Cord: There is a discrete 8 mm intramedullary signal abnormality with subtle expansion and hazy faint enhancement. The lesion was skipped over on axial images but appears to be central in the cord. Differential considerations: Demyelination- this would be a solitary lesion as there is no specific demyelinating pattern in the brain or elsewhere in the cord. This is a short segment lesion unlike transverse myelitis. Neoplasm- hemangioblastoma or ependymoma could have this appearance, hemangioma is usually more hypervascular and ependymoma is usually larger. This would be a small lesion for astrocytoma. Medullary metastases rare and there is no history of malignancy. Infection- short-segment, unlike transverse myelitis. No ring enhancement for infectious collection. Vascular- this would be an atypical pattern. Posttraumatic - no posttraumatic soft tissue/osseous findings. This is not at a disc level to suggest  contusion. Inflammatory-  neurosarcoid should be considered. Paraspinal and other soft tissues: Negative Disc levels: T5-6: Central disc herniation contacting the ventral cord with ventral cord flattening MRI LUMBAR SPINE FINDINGS Segmentation:  Standard. Alignment:  Physiologic. Vertebrae:  No fracture, evidence of discitis, or bone lesion. Conus medullaris: Extends to the L1 level and appears normal. Paraspinal and other soft tissues: Negative Disc levels: L5-S1: Disc protrusion eccentric to the right. This could contact the S1 nerve root but no mass effect. IMPRESSION: 1. 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 Please see above differential diagnosis in the thoracic spine "cord" header. 2. Overall mild degenerative changes in the cervical, thoracic, and lumbar spine which are described above. No high-grade stenosis. Electronically Signed   By: Marnee Spring M.D.   On: 12/13/2016 17:34   Mr Lumbar Spine W Wo Contrast  Result Date: 12/13/2016 CLINICAL DATA:  Paresthesia on the plantar feet. Saddle anesthesia. Left shoulder sensation abnormality. EXAM: MRI CERVICAL, THORACIC AND LUMBAR SPINE WITHOUT CONTRAST TECHNIQUE: Multiplanar and multiecho pulse sequences of the cervical spine, to include the craniocervical junction and cervicothoracic junction, and thoracic and lumbar spine, were obtained without intravenous contrast. COMPARISON:  None. FINDINGS: MRI CERVICAL SPINE FINDINGS Alignment: Physiologic. Vertebrae: No fracture, evidence of discitis, or bone lesion. Cord: Normal signal and morphology. Posterior Fossa, vertebral arteries, paraspinal tissues: Negative. Disc levels: C2-3: Unremarkable. C3-4: Shallow disc protrusion and endplate ridging central to right paracentral. No impingement C4-5: Unremarkable. C5-6: Shallow rightward disc protrusion and minimal endplate ridging. Patent canal and foramina C6-7: Left foraminal protrusion or uncovertebral ridge with mild stenosis (axial T2  weighted imaging over estimates narrowing due to slice selection). Mild posterior endplate ridging and disc bulging. C7-T1:Unremarkable. MRI THORACIC SPINE FINDINGS Alignment: Exaggerated kyphosis. 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 Vertebrae: Multiple endplate irregularities. No acute fracture, discitis, or aggressive bone lesion. Cord: There is a discrete 8 mm intramedullary signal abnormality with subtle expansion and hazy faint enhancement. The lesion was skipped over on axial images but appears to be central in the cord. Differential considerations: Demyelination- this would be a solitary lesion as there is no specific demyelinating pattern in the brain or elsewhere in the cord. This is a short segment lesion unlike transverse myelitis. Neoplasm- hemangioblastoma or ependymoma could have this appearance, hemangioma is usually more hypervascular and ependymoma is usually larger. This would be a small lesion for astrocytoma. Medullary metastases rare and there is no history of malignancy. Infection- short-segment, unlike transverse myelitis. No ring enhancement for infectious collection. Vascular- this would be an atypical pattern. Posttraumatic - no posttraumatic soft tissue/osseous findings. This is not at a disc level to suggest contusion. Inflammatory- neurosarcoid should be considered. Paraspinal and other soft tissues: Negative Disc levels: T5-6: Central disc herniation contacting the ventral cord with ventral cord flattening MRI LUMBAR SPINE FINDINGS Segmentation:  Standard. Alignment:  Physiologic. Vertebrae:  No fracture, evidence of discitis, or bone lesion. Conus medullaris: Extends to the L1 level and appears normal. Paraspinal and other soft tissues: Negative Disc levels: L5-S1: Disc protrusion eccentric to the right. This could contact the S1 nerve root but no mass effect. IMPRESSION: 1. Solitary 8 mm  intramedullary cord lesion at T11, indeterminate. Please see above differential diagnosis in the thoracic spine "cord" header. 2. Overall mild degenerative changes in the cervical, thoracic, and lumbar spine which are described above. No high-grade stenosis. Electronically Signed   By: Marnee Spring M.D.   On: 12/13/2016 17:34  LOS: 3 days   Jeoffrey Massed, MD  [facility] Pager:[phone]  If 7PM-7AM, please contact night-coverage [email or web address] Password [email or web address] 12/17/2016, 1:28 PM

## 2016-12-17 NOTE — Progress Notes (Signed)
Subjective: Again patient states he still has some numbness and tingling at the bottom of his feet but other than that he is significantly improved. Saddle anesthesia states the only anesthesia that he has is down at his ankles. His been walking around the hallway with no difficulty. Intent is to have no bowel or bladder abnormalities. His last dose Solu-Medrol will be tomorrow.  Objective: Current vital signs: BP 119/83 (BP Location: Left Arm)   Pulse 66   Temp 97.9 F (36.6 C) (Oral)   Resp 18   Ht 5\' 8"  (1.727 m)   Wt 73.7 kg (162 lb 7.7 oz)   SpO2 98%   BMI 24.70 kg/m  Vital signs in last 24 hours: Temp:  [97.9 F (36.6 C)-99 F (37.2 C)] 97.9 F (36.6 C) (05/08 0603) Pulse Rate:  [50-66] 66 (05/08 0603) Resp:  [18] 18 (05/08 0603) BP: (119-126)/(76-83) 119/83 (05/08 0603) SpO2:  [97 %-98 %] 98 % (05/08 0603)  Intake/Output from previous day: 05/07 0701 - 05/08 0700 In: 890 [P.O.:840; IV Piggyback:50] Out: -  Intake/Output this shift: Total I/O In: 240 [P.O.:240] Out: -  Nutritional status: Diet Heart Room service appropriate? Yes; Fluid consistency: Thin  ROS:                                                                                                                                       History obtained from the patient  General ROS: negative for - chills, fatigue, fever, night sweats, weight gain or weight loss Psychological ROS: negative for - behavioral disorder, hallucinations, memory difficulties, mood swings or suicidal ideation Ophthalmic ROS: negative for - blurry vision, double vision, eye pain or loss of vision ENT ROS: negative for - epistaxis, nasal discharge, oral lesions, sore throat, tinnitus or vertigo Allergy and Immunology ROS: negative for - hives or itchy/watery eyes Hematological and Lymphatic ROS: negative for - bleeding problems, bruising or swollen lymph nodes Endocrine ROS: negative for - galactorrhea, hair pattern changes,  polydipsia/polyuria or temperature intolerance Respiratory ROS: negative for - cough, hemoptysis, shortness of breath or wheezing Cardiovascular ROS: negative for - chest pain, dyspnea on exertion, edema or irregular heartbeat Gastrointestinal ROS: negative for - abdominal pain, diarrhea, hematemesis, nausea/vomiting or stool incontinence Genito-Urinary ROS: negative for - dysuria, hematuria, incontinence or urinary frequency/urgency Musculoskeletal ROS: negative for - joint swelling or muscular weakness Neurological ROS: as noted in HPI Dermatological ROS: negative for rash and skin lesion changes    Neurologic Exam: General: NAD Mental Status: Alert, oriented, thought content appropriate.  Speech fluent without evidence of aphasia.  Able to follow 3 step commands without difficulty. CN: Pupils are equal and reactive from 3-->2 mm bilaterally. EOMI, no nystagmus. Facial sensation is intact to light touch. Face is symmetric at rest with normal strength and mobility. Hearing is intact to conversational voice. Voice is normal in tone and quality. Palate elevates symmetrically. Uvula is midline.  Bilateral SCM and trapezii are 5/5. Tongue is midline with normal bulk and mobility.  Motor: Normal bulk, tone, and strength throughout. No pronator drift. No tremor or other abnormal movements are observed.  Sensation: Intact to light touch. He reports mildly reduced pinprick in BLE below the knees. Vibration is absent in both feet and ankles, diminished at both knees. Joint position is mildly impaired at both great toes.  DTRs: 3+, symmetric. Toes are upgoing bilaterally.  Coordination: Finger-to-nose and heel-to-shin are without dysmetria bilaterally.   Lab Results:  RPR within normal limits HIV antibody negative ace antibody negative SSA and SSB negative ANA negative Homocystine negative Mentalmylonic acid pending Copper pending      Basic Metabolic Panel:  Recent Labs Lab  12/13/16 1155 12/14/16 0021  NA 140 139  K 3.8 3.9  CL 107 106  CO2 25 28  GLUCOSE 95 97  BUN 10 9  CREATININE 1.03 1.07  CALCIUM 9.1 8.9  MG  --  2.1    Liver Function Tests:  Recent Labs Lab 12/14/16 0436 12/14/16 0732  ALBUMIN 4.6 4.2   No results for input(s): LIPASE, AMYLASE in the last 168 hours. No results for input(s): AMMONIA in the last 168 hours.  CBC:  Recent Labs Lab 12/13/16 2308 12/14/16 0021 12/14/16 1310  WBC 13.6* 12.2*  --   HGB 16.8 15.8  --   HCT 48.1 45.4 48.6  MCV 90.9 91.3  --   PLT 256 244  --     Cardiac Enzymes: No results for input(s): CKTOTAL, CKMB, CKMBINDEX, TROPONINI in the last 168 hours.  Lipid Panel: No results for input(s): CHOL, TRIG, HDL, CHOLHDL, VLDL, LDLCALC in the last 168 hours.  CBG:  Recent Labs Lab 12/15/16 1147 12/16/16 1147 12/16/16 1728 12/17/16 0011 12/17/16 0600  GLUCAP 119* 98 165* 120* 99    Microbiology: No results found for this or any previous visit.  Coagulation Studies: No results for input(s): LABPROT, INR in the last 72 hours.  Imaging: No results found.  Medications:  Scheduled: . cyanocobalamin  1,000 mcg Intramuscular Daily  . gabapentin  300 mg Oral TID  . sodium chloride flush  3 mL Intravenous Q12H    Assessment/Plan:  Transverse myelitis with etiology which remains unclear. Thus far majority of his labs come back normal. Still awaiting Copper. His vitamin D was slightly low but not significant. Patient has significantly improved symptomatically with IV Solu-Medrol. She will not need any taper at time of discharge as this was a pulsed dose. Would have patient follow-up with his primary neurologist Dr. Marjory LiesPenumalli at approximately 7-14 days after discharge.      Felicie MornDavid Travius Crochet PA-C [facility] [phone]  12/17/2016, 10:58 AM

## 2016-12-18 ENCOUNTER — Telehealth: Payer: Self-pay | Admitting: Diagnostic Neuroimaging

## 2016-12-18 LAB — OLIGOCLONAL BANDS, CSF + SERM

## 2016-12-18 LAB — GLUCOSE, CAPILLARY: GLUCOSE-CAPILLARY: 97 mg/dL (ref 65–99)

## 2016-12-18 NOTE — Discharge Summary (Signed)
Physician Discharge Summary  Chad Webb ZOX:[id number] DOB: 1971/07/25 DOA: 12/13/2016  PCP: Roger Kill, PA-C  Admit date: 12/13/2016 Discharge date: 12/18/2016  Time spent: > 35 minutes  Recommendations for Outpatient Follow-up:  1. Ensure patient follows up with neurologist and neurology can follow up on pending work up see below   Discharge Diagnoses:  Principal Problem:   Spinal cord lesion (HCC) Active Problems:   Saddle anesthesia   Numbness and tingling of both lower extremities   Discharge Condition: stable  Diet recommendation: Heart healthy  Filed Weights   12/13/16 2242  Weight: 73.7 kg (162 lb 7.7 oz)    History of present illness:  46 y.o. male with no significant past medical part from vitamin B12 deficiency (vitamin B12 level 221 as outpatient) history-admitted with bilateral foot numbness/tingling/saddle anesthesia. MRI of the thoracic spine showed a enhancing lesion at T11. Thought to have transverse myelitis, admitted and started on high-dose steroids.   Hospital Course:  T11 transverse myelitis: Symptoms improving, continue high-dose IV steroids 5 days-stop date on 5/9. Per neurology, patient does not require tapering steroids on discharge. Autoimmune panel negative, HIV antibody and RPR negative. ACE negative. Awaiting CSF oligoclonal bands .  Vitamin B12 deficiency: Methylmalonic acid and homocystine levels are within normal limits, continue vitamin B12 supplementation.  Vitamin D deficiency:begin supplementation  Procedures:  None  Consultations:  Neurology  Discharge Exam: Vitals:   12/17/16 2016 12/18/16 0504  BP: 137/80 125/68  Pulse: 63 70  Resp: 17 17  Temp: 98 F (36.7 C) 98 F (36.7 C)    General: Pt in nad, alert and awake Cardiovascular: rrr, no mrg Respiratory: no increased wob, no wheezes  Discharge Instructions   Discharge Instructions    Call MD for:  redness, tenderness, or signs of infection (pain,  swelling, redness, odor or green/yellow discharge around incision site)    Complete by:  As directed    Call MD for:  temperature >100.4    Complete by:  As directed    Diet - low sodium heart healthy    Complete by:  As directed    Increase activity slowly    Complete by:  As directed      Current Discharge Medication List    CONTINUE these medications which have NOT CHANGED   Details  cyanocobalamin 500 MCG tablet Take 500 mcg by mouth daily.    gabapentin (NEURONTIN) 300 MG capsule Take 1 capsule (300 mg total) by mouth 3 (three) times daily. Qty: 90 capsule, Refills: 6       Allergies  Allergen Reactions  . Latex Rash      The results of significant diagnostics from this hospitalization (including imaging, microbiology, ancillary and laboratory) are listed below for reference.    Significant Diagnostic Studies: Mr Laqueta Jean And Wo Contrast  Result Date: 12/13/2016 CLINICAL DATA:  Myelopathy. EXAM: MRI HEAD WITHOUT AND WITH CONTRAST TECHNIQUE: Multiplanar, multiecho pulse sequences of the brain and surrounding structures were obtained without and with intravenous contrast. CONTRAST:  15mL MULTIHANCE GADOBENATE DIMEGLUMINE 529 MG/ML IV SOLN COMPARISON:  None. FINDINGS: Brain: 2 small FLAIR hyperintensities in the cerebral white matter. No specific demyelinating pattern. No signal abnormality along the third ventricle or cerebral aqua duct. No acute infarct, hemorrhage, hydrocephalus, or mass. Normal brain volume. Vascular: Normal flow voids. Skull and upper cervical spine: Negative. Sinuses/Orbits: No evidence of optic neuritis. Small mucous retention cysts in the right sphenoid sinus. IMPRESSION: No explanation for myelopathy.  No demyelinating pattern. Electronically  Signed   By: Marnee Spring M.D.   On: 12/13/2016 17:38   Mr Cervical Spine W Or Wo Contrast  Result Date: 12/13/2016 CLINICAL DATA:  Paresthesia on the plantar feet. Saddle anesthesia. Left shoulder sensation  abnormality. EXAM: MRI CERVICAL, THORACIC AND LUMBAR SPINE WITHOUT CONTRAST TECHNIQUE: Multiplanar and multiecho pulse sequences of the cervical spine, to include the craniocervical junction and cervicothoracic junction, and thoracic and lumbar spine, were obtained without intravenous contrast. COMPARISON:  None. FINDINGS: MRI CERVICAL SPINE FINDINGS Alignment: Physiologic. Vertebrae: No fracture, evidence of discitis, or bone lesion. Cord: Normal signal and morphology. Posterior Fossa, vertebral arteries, paraspinal tissues: Negative. Disc levels: C2-3: Unremarkable. C3-4: Shallow disc protrusion and endplate ridging central to right paracentral. No impingement C4-5: Unremarkable. C5-6: Shallow rightward disc protrusion and minimal endplate ridging. Patent canal and foramina C6-7: Left foraminal protrusion or uncovertebral ridge with mild stenosis (axial T2 weighted imaging over estimates narrowing due to slice selection). Mild posterior endplate ridging and disc bulging. C7-T1:Unremarkable. MRI THORACIC SPINE FINDINGS Alignment: Exaggerated kyphosis. 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 Vertebrae: Multiple endplate irregularities. No acute fracture, discitis, or aggressive bone lesion. Cord: There is a discrete 8 mm intramedullary signal abnormality with subtle expansion and hazy faint enhancement. The lesion was skipped over on axial images but appears to be central in the cord. Differential considerations: Demyelination- this would be a solitary lesion as there is no specific demyelinating pattern in the brain or elsewhere in the cord. This is a short segment lesion unlike transverse myelitis. Neoplasm- hemangioblastoma or ependymoma could have this appearance, hemangioma is usually more hypervascular and ependymoma is usually larger. This would be a small lesion for astrocytoma. Medullary metastases rare and there is no  history of malignancy. Infection- short-segment, unlike transverse myelitis. No ring enhancement for infectious collection. Vascular- this would be an atypical pattern. Posttraumatic - no posttraumatic soft tissue/osseous findings. This is not at a disc level to suggest contusion. Inflammatory- neurosarcoid should be considered. Paraspinal and other soft tissues: Negative Disc levels: T5-6: Central disc herniation contacting the ventral cord with ventral cord flattening MRI LUMBAR SPINE FINDINGS Segmentation:  Standard. Alignment:  Physiologic. Vertebrae:  No fracture, evidence of discitis, or bone lesion. Conus medullaris: Extends to the L1 level and appears normal. Paraspinal and other soft tissues: Negative Disc levels: L5-S1: Disc protrusion eccentric to the right. This could contact the S1 nerve root but no mass effect. IMPRESSION: 1. 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 Please see above differential diagnosis in the thoracic spine "cord" header. 2. Overall mild degenerative changes in the cervical, thoracic, and lumbar spine which are described above. No high-grade stenosis. Electronically Signed   By: Marnee Spring M.D.   On: 12/13/2016 17:34   Mr Thoracic Spine W Wo Contrast  Result Date: 12/13/2016 CLINICAL DATA:  Paresthesia on the plantar feet. Saddle anesthesia. Left shoulder sensation abnormality. EXAM: MRI CERVICAL, THORACIC AND LUMBAR SPINE WITHOUT CONTRAST TECHNIQUE: Multiplanar and multiecho pulse sequences of the cervical spine, to include the craniocervical junction and cervicothoracic junction, and thoracic and lumbar spine, were obtained without intravenous contrast. COMPARISON:  None. FINDINGS: MRI CERVICAL SPINE FINDINGS Alignment: Physiologic. Vertebrae: No fracture, evidence of discitis, or bone lesion. Cord: Normal signal and morphology. Posterior Fossa, vertebral arteries, paraspinal tissues: Negative. Disc levels: C2-3: Unremarkable. C3-4: Shallow disc  protrusion and endplate ridging central to right paracentral. No impingement C4-5: Unremarkable. C5-6: Shallow rightward disc protrusion and minimal  endplate ridging. Patent canal and foramina C6-7: Left foraminal protrusion or uncovertebral ridge with mild stenosis (axial T2 weighted imaging over estimates narrowing due to slice selection). Mild posterior endplate ridging and disc bulging. C7-T1:Unremarkable. MRI THORACIC SPINE FINDINGS Alignment: Exaggerated kyphosis. 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 Vertebrae: Multiple endplate irregularities. No acute fracture, discitis, or aggressive bone lesion. Cord: There is a discrete 8 mm intramedullary signal abnormality with subtle expansion and hazy faint enhancement. The lesion was skipped over on axial images but appears to be central in the cord. Differential considerations: Demyelination- this would be a solitary lesion as there is no specific demyelinating pattern in the brain or elsewhere in the cord. This is a short segment lesion unlike transverse myelitis. Neoplasm- hemangioblastoma or ependymoma could have this appearance, hemangioma is usually more hypervascular and ependymoma is usually larger. This would be a small lesion for astrocytoma. Medullary metastases rare and there is no history of malignancy. Infection- short-segment, unlike transverse myelitis. No ring enhancement for infectious collection. Vascular- this would be an atypical pattern. Posttraumatic - no posttraumatic soft tissue/osseous findings. This is not at a disc level to suggest contusion. Inflammatory- neurosarcoid should be considered. Paraspinal and other soft tissues: Negative Disc levels: T5-6: Central disc herniation contacting the ventral cord with ventral cord flattening MRI LUMBAR SPINE FINDINGS Segmentation:  Standard. Alignment:  Physiologic. Vertebrae:  No fracture, evidence of  discitis, or bone lesion. Conus medullaris: Extends to the L1 level and appears normal. Paraspinal and other soft tissues: Negative Disc levels: L5-S1: Disc protrusion eccentric to the right. This could contact the S1 nerve root but no mass effect. IMPRESSION: 1. 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 Please see above differential diagnosis in the thoracic spine "cord" header. 2. Overall mild degenerative changes in the cervical, thoracic, and lumbar spine which are described above. No high-grade stenosis. Electronically Signed   By: Marnee SpringJonathon  Watts M.D.   On: 12/13/2016 17:34   Mr Lumbar Spine W Wo Contrast  Result Date: 12/13/2016 CLINICAL DATA:  Paresthesia on the plantar feet. Saddle anesthesia. Left shoulder sensation abnormality. EXAM: MRI CERVICAL, THORACIC AND LUMBAR SPINE WITHOUT CONTRAST TECHNIQUE: Multiplanar and multiecho pulse sequences of the cervical spine, to include the craniocervical junction and cervicothoracic junction, and thoracic and lumbar spine, were obtained without intravenous contrast. COMPARISON:  None. FINDINGS: MRI CERVICAL SPINE FINDINGS Alignment: Physiologic. Vertebrae: No fracture, evidence of discitis, or bone lesion. Cord: Normal signal and morphology. Posterior Fossa, vertebral arteries, paraspinal tissues: Negative. Disc levels: C2-3: Unremarkable. C3-4: Shallow disc protrusion and endplate ridging central to right paracentral. No impingement C4-5: Unremarkable. C5-6: Shallow rightward disc protrusion and minimal endplate ridging. Patent canal and foramina C6-7: Left foraminal protrusion or uncovertebral ridge with mild stenosis (axial T2 weighted imaging over estimates narrowing due to slice selection). Mild posterior endplate ridging and disc bulging. C7-T1:Unremarkable. MRI THORACIC SPINE FINDINGS Alignment: Exaggerated kyphosis. There multiple endplate irregularities and some remodeling of vertebral bodies, findings seen with prior Scheuermann's  disease. Vertebrae: Multiple endplate irregularities. No acute fracture, discitis, or aggressive bone lesion. Cord: There is a discrete 8 mm intramedullary signal abnormality with subtle expansion and hazy faint enhancement. The lesion was skipped over on axial images but appears to be central in the cord. Differential considerations: Demyelination- this would be a solitary lesion as there is no specific demyelinating pattern in the brain or elsewhere in the cord. This is a short segment lesion unlike transverse  myelitis. Neoplasm- hemangioblastoma or ependymoma could have this appearance, hemangioma is usually more hypervascular and ependymoma is usually larger. This would be a small lesion for astrocytoma. Medullary metastases rare and there is no history of malignancy. Infection- short-segment, unlike transverse myelitis. No ring enhancement for infectious collection. Vascular- this would be an atypical pattern. Posttraumatic - no posttraumatic soft tissue/osseous findings. This is not at a disc level to suggest contusion. Inflammatory- neurosarcoid should be considered. Paraspinal and other soft tissues: Negative Disc levels: T5-6: Central disc herniation contacting the ventral cord with ventral cord flattening MRI LUMBAR SPINE FINDINGS Segmentation:  Standard. Alignment:  Physiologic. Vertebrae:  No fracture, evidence of discitis, or bone lesion. Conus medullaris: Extends to the L1 level and appears normal. Paraspinal and other soft tissues: Negative Disc levels: L5-S1: Disc protrusion eccentric to the right. This could contact the S1 nerve root but no mass effect. IMPRESSION: 1. 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 Please see above differential diagnosis in the thoracic spine "cord" header. 2. Overall mild degenerative changes in the cervical, thoracic, and lumbar spine which are described above. No high-grade stenosis. Electronically Signed   By: Marnee Spring M.D.   On:  12/13/2016 17:34    Microbiology: No results found for this or any previous visit (from the past 240 hour(s)).   Labs: Basic Metabolic Panel:  Recent Labs Lab 12/13/16 1155 12/14/16 0021  NA 140 139  K 3.8 3.9  CL 107 106  CO2 25 28  GLUCOSE 95 97  BUN 10 9  CREATININE 1.03 1.07  CALCIUM 9.1 8.9  MG  --  2.1   Liver Function Tests:  Recent Labs Lab 12/14/16 0436 12/14/16 0732  ALBUMIN 4.6 4.2   No results for input(s): LIPASE, AMYLASE in the last 168 hours. No results for input(s): AMMONIA in the last 168 hours. CBC:  Recent Labs Lab 12/13/16 2308 12/14/16 0021 12/14/16 1310  WBC 13.6* 12.2*  --   HGB 16.8 15.8  --   HCT 48.1 45.4 48.6  MCV 90.9 91.3  --   PLT 256 244  --    Cardiac Enzymes: No results for input(s): CKTOTAL, CKMB, CKMBINDEX, TROPONINI in the last 168 hours. BNP: BNP (last 3 results) No results for input(s): BNP in the last 8760 hours.  ProBNP (last 3 results) No results for input(s): PROBNP in the last 8760 hours.  CBG:  Recent Labs Lab 12/17/16 0600 12/17/16 1202 12/17/16 1635 12/17/16 2126 12/18/16 0649  GLUCAP 99 126* 119* 159* 97       Signed:  Penny Pia MD.  [facility] 12/18/2016, 9:39 AM

## 2016-12-18 NOTE — Progress Notes (Signed)
Discharge instructions provided to patient.  IV removed.  Last dose of solu-medrol administered prior to discharge.  Wife present.  No questions at time of discharge.

## 2016-12-18 NOTE — Telephone Encounter (Signed)
Called patient to clarify message from front desk, re: needs FU MRI within 10 days with us. Patient stated he was told by dr at hospital he'd need a follow up MRI to check progress after receiving steroids. He stated he is feeling better, still has some numbness on bottom of feet, but it is "80% better".  This RN advised would discuss with Dr Marjory LiesPenumalli, and he would get a call back. Patient has one month FU scheduled with Dr Marjory LiesPenumalli. Made pt aware Dr Marjory LiesPenumalli is out of office this afternoon, but in the office tomorrow and Friday. Patient verbalized understanding, appreciation.

## 2016-12-19 DIAGNOSIS — Z0289 Encounter for other administrative examinations: Secondary | ICD-10-CM

## 2016-12-19 NOTE — Telephone Encounter (Signed)
Spoke to pt and relayed that moved up f/u appt in 2 wks with Dr. Marjory LiesPenumalli. 01-01-17 at 1430.  (be here 1415).  Pt verbalized understanding.

## 2016-12-19 NOTE — Telephone Encounter (Signed)
Please setup follow up in 2 weeks with me. -VRP

## 2016-12-23 LAB — B. BURGDORFI ANTIBODIES, CSF

## 2016-12-25 ENCOUNTER — Telehealth: Payer: Self-pay | Admitting: *Deleted

## 2016-12-25 NOTE — Telephone Encounter (Signed)
[employer] FMLA papers for wife completed, signed, sent to MR for processing.

## 2016-12-25 NOTE — Telephone Encounter (Signed)
Spoke with patient and rescheduled his follow up for this Friday due to dr out of office next Wed afternoon. Advised he arrive 20 minutes early to check in. Patient verbalized understanding.

## 2016-12-27 ENCOUNTER — Encounter: Payer: Self-pay | Admitting: Diagnostic Neuroimaging

## 2016-12-27 ENCOUNTER — Ambulatory Visit (INDEPENDENT_AMBULATORY_CARE_PROVIDER_SITE_OTHER): Payer: Managed Care, Other (non HMO) | Admitting: Diagnostic Neuroimaging

## 2016-12-27 VITALS — BP 128/84 | HR 64 | Wt 157.0 lb

## 2016-12-27 DIAGNOSIS — R202 Paresthesia of skin: Secondary | ICD-10-CM

## 2016-12-27 DIAGNOSIS — R200 Anesthesia of skin: Secondary | ICD-10-CM

## 2016-12-27 DIAGNOSIS — G373 Acute transverse myelitis in demyelinating disease of central nervous system: Secondary | ICD-10-CM | POA: Diagnosis not present

## 2016-12-27 NOTE — Patient Instructions (Addendum)
Thank you for coming to see [place] at [facility]. I hope we have been able to provide you high quality care today.  You may receive a patient satisfaction survey over the next few weeks. We would appreciate your feedback and comments so that we may continue to improve ourselves and the health of our patients.   - monitor symptoms  - may consider repeat MRI thoracic spine scan in 6 weeks depending on symptoms  - then may consider follow up MRI brain scan in 6-12 months depending on symptoms  - continue vitamin D   ~~~~~~~~~~~~~~~~~~~~~~~~~~~~~~~~~~~~~~~~~~~~~~~~~~~~~~~~~~~~~~~~~  DR. Meriam Chojnowski'S GUIDE TO HAPPY AND HEALTHY LIVING These are some of my general health and wellness recommendations. Some of them may apply to you better than others. Please use common sense as you try these suggestions and feel free to ask me any questions.   ACTIVITY/FITNESS Mental, social, emotional and physical stimulation are very important for brain and body health. Try learning a new activity (arts, music, language, sports, games).  Keep moving your body to the best of your abilities. You can do this at home, inside or outside, the park, community center, gym or anywhere you like. Consider a physical therapist or personal trainer to get started. Consider the app Sworkit. Fitness trackers such as smart-watches, smart-phones or Fitbits can help as well.   NUTRITION Eat more plants: colorful vegetables, nuts, seeds and berries.  Eat less sugar, salt, preservatives and processed foods.  Avoid toxins such as cigarettes and alcohol.  Drink water when you are thirsty. Warm water with a slice of lemon is an excellent morning drink to start the day.  Consider these websites for more information The Nutrition Source (https://www.henry-hernandez.biz/) Precision Nutrition (WindowBlog.ch)   RELAXATION Consider practicing mindfulness meditation or  other relaxation techniques such as deep breathing, prayer, yoga, tai chi, massage. See website mindful.org or the apps Headspace or Calm to help get started.   SLEEP Try to get at least 7-8+ hours sleep per day. Regular exercise and reduced caffeine will help you sleep better. Practice good sleep hygeine techniques. See website sleep.org for more information.   PLANNING Prepare estate planning, living will, healthcare POA documents. Sometimes this is best planned with the help of an attorney. Theconversationproject.org and agingwithdignity.org are excellent resources.

## 2016-12-27 NOTE — Progress Notes (Signed)
[facility]  PATIENT: Chad Webb DOB: [birth year]  REFERRING CLINICIAN: B Williams HISTORY FROM: patient and wife  REASON FOR VISIT: follow up   HISTORICAL  CHIEF COMPLAINT:  Chief Complaint  Patient presents with  . Weakness of bilateral LE    rm 7, ""still have numbness in bottom of feet, sems like it is getting better; stopped Gabapentin due to drowsiness, glazed look, could not function""  . Follow-up    2 weeks FU, s/p MRIs     HISTORY OF PRESENT ILLNESS:   UPDATE 12/27/16: Since last visit, went to ER, had MRIs, found to have inflamm lesion in the conus medullaris, and treated with steroids, with significant improvement. Now with mild residual numbness in bottom of feet. Pins and needles resolved. Saddle anesthesia improved. Mild erectile dysfunction continues.  Gait and balance improved.   PRIOR HPI (12/13/16): 46 year old right-handed male here for evaluation of lower extremity numbness. 2 weeks ago patient woke up and had sudden onset numbness and tingling in his feet. He notices mainly in the bottom of his feet. Now symptoms affect bilateral feet, up to the ankles. He is also noticed some balance and walking difficulty. He feels unsteady walking his feet. He feels as though he could roll his ankle. One week later he noticed saddle numbness, and his genital/groin area, could not feel when wiping with toilet paper. This has continued. No bowel or bladder incontinence. Patient denies any numbness or weakness from his knees up to his hips. No numbness in his chest or abdomen. No problems with his right arm. In November 2017 patient had some type of left shoulder injury when he was reaching, felt something pop, and then had significant pain in his left shoulder region. He thought he might of injured his left rotator cuff. No other preceding or prodromal injuries, accidents, infections, change in diet or activity. Patient went to PCP, had evaluation, found to have  B12 level slightly low at 221. Also had slight elevation in white blood cell count of 14.4. However no signs or symptoms of infection otherwise.    REVIEW OF SYSTEMS: Full 14 system review of systems performed and negative with exception of: abd pain constipation back pain.    ALLERGIES: Allergies  Allergen Reactions  . Latex Rash    HOME MEDICATIONS: Outpatient Medications Prior to Visit  Medication Sig Dispense Refill  . cyanocobalamin 500 MCG tablet Take 500 mcg by mouth daily.    Marland Kitchen. gabapentin (NEURONTIN) 300 MG capsule Take 1 capsule (300 mg total) by mouth 3 (three) times daily. (Patient not taking: Reported on 12/13/2016) 90 capsule 6   No facility-administered medications prior to visit.     PAST MEDICAL HISTORY: Past Medical History:  Diagnosis Date  . Scoliosis     PAST SURGICAL HISTORY: No past surgical history on file.  FAMILY HISTORY: Family History  Problem Relation Age of Onset  . Ataxia Maternal Grandmother     SOCIAL HISTORY:  Social History   Social History  . Marital status: Married    Spouse name: Eileen StanfordJenna  . Number of children: 3  . Years of education: 12   Occupational History  .      [employer]   Social History Main Topics  . Smoking status: Current Every Day Smoker    Packs/day: 1.00  . Smokeless tobacco: Never Used  . Alcohol use Yes     Comment: weekend  . Drug use: No  . Sexual activity: Not on file  Other Topics Concern  . Not on file   Social History Narrative   Lives with family   Caffeine- coffee 2 cups daily     PHYSICAL EXAM  GENERAL EXAM/CONSTITUTIONAL: Vitals:  Vitals:   12/27/16 1110  BP: 128/84  Pulse: 64  Weight: 157 lb (71.2 kg)   Body mass index is 23.87 kg/m. No exam data present  Patient is in no distress; well developed, nourished and groomed; neck is supple  CARDIOVASCULAR:  Examination of carotid arteries is normal; no carotid bruits  Regular rate and rhythm, no murmurs  Examination of  peripheral vascular system by observation and palpation is normal  EYES:  Ophthalmoscopic exam of optic discs and posterior segments is normal; no papilledema or hemorrhages  MUSCULOSKELETAL:  Gait, strength, tone, movements noted in Neurologic exam below  NEUROLOGIC: MENTAL STATUS:  No flowsheet data found.  awake, alert, oriented to person, place and time  recent and remote memory intact  normal attention and concentration  language fluent, comprehension intact, naming intact,   fund of knowledge appropriate  CRANIAL NERVE:   2nd - no papilledema on fundoscopic exam  2nd, 3rd, 4th, 6th - pupils equal and reactive to light, visual fields full to confrontation, extraocular muscles intact, no nystagmus  5th - facial sensation symmetric  7th - facial strength symmetric  8th - hearing intact  9th - palate elevates symmetrically, uvula midline  11th - shoulder shrug symmetric  12th - tongue protrusion midline  MOTOR:   normal bulk and tone, full strength in the BUE, BLE  SENSORY:   normal and symmetric to light touch, temperature, vibration  COORDINATION:   finger-nose-finger, fine finger movements normal  REFLEXES:   deep tendon reflexes --> BUE 2, RIGHT KNEE 3, LEFT KNEE 2, ANKLES 1  GAIT/STATION:   STABLE GAIT; ABLE TO WALK TOES, HEELS AND TANDEM; ROMBERG NEGATIVE     DIAGNOSTIC DATA (LABS, IMAGING, TESTING) - I reviewed patient records, labs, notes, testing and imaging myself where available.  Lab Results  Component Value Date   WBC 12.2 (H) 12/14/2016   HGB 15.8 12/14/2016   HCT 48.6 12/14/2016   MCV 91.3 12/14/2016   PLT 244 12/14/2016      Component Value Date/Time   NA 139 12/14/2016 0021   K 3.9 12/14/2016 0021   CL 106 12/14/2016 0021   CO2 28 12/14/2016 0021   GLUCOSE 97 12/14/2016 0021   BUN 9 12/14/2016 0021   CREATININE 1.07 12/14/2016 0021   CALCIUM 8.9 12/14/2016 0021   ALBUMIN 4.2 12/14/2016 0732   ALBUMIN 4.6  12/14/2016 0436   GFRNONAA >60 12/14/2016 0021   GFRAA >60 12/14/2016 0021   No results found for: CHOL, HDL, LDLCALC, LDLDIRECT, TRIG, CHOLHDL No results found for: [id number] Lab Results  Component Value Date   VITAMINB12 >7,500 (H) 12/14/2016   Lab Results  Component Value Date   TSH 5.279 (H) 12/14/2016     Outside labs (12/04/16)  A1c 5.3  B12 221  TSH 1.359  CBC: WBC 14.4 (N66, L27, M7, E1, B0)  CMP: Cr 1.1, Na 142   12/13/16 MRI brain [I reviewed images myself and agree with interpretation. -VRP]  - 2 small FLAIR hyperintensities in the cerebral white matter. No specific demyelinating pattern. - No explanation for myelopathy.  No demyelinating pattern.  12/13/16 MRI cervical / thoracic / lumbar spine [I reviewed images myself and agree with interpretation. Suspect autoimmune or inflammatory etiology. -VRP]  - Solitary 8 mm intramedullary cord lesion  at T11, indeterminate. Subtle expansion and hazy faint enhancement.  - Overall mild degenerative changes in the cervical, thoracic, and lumbar spine which are described above. No high-grade stenosis.  12/14/16 CSF TESTING  IgG, CSF 0.0 - 8.6 mg/dL 1.5   Albumin CSF-mCnc 11 - 48 mg/dL 9    IgG (Immunoglobin G), Serum 700 - 1,600 mg/dL 098    Albumin 3.5 - 5.5 g/dL 4.6   IgG/Alb Ratio, CSF 0.00 - 0.25 0.17   CSF IgG Index 0.0 - 0.7 1.1    Comment: (NOTE)    CSF OCB: Three (3) oligoclonal bands were observed in the CSF, which were not detected in the serum sample.  CSF VDRL negative CSF lyme ab negative CSF protein 18 CSF glucose 53 CSF WBC 1 CSF RBC 29   12/14/16 LAB TESTING ANA neg, SSA/SSB neg, copper nl, vit E nl, ACE nl, HIV neg, RPR neg     ASSESSMENT AND PLAN  46 y.o. year old male here with sudden onset lower extremity numbness and weakness in the bilateral feet and ankles in April 2018, with saddle anesthesia. Patient had lower extremity weakness, left worse than right, with hyperreflexia in the upper and  lower extremities, and saddle anesthesia. These signs and symptoms are concerning for lower thoracic spinal cord or conus medullaris pathology. This was confirmed on MRI thoracic spine testing. 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 Symptoms significantly improved with IV steroids.    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 Idiopathic transverse myelitis (HCC)   2. Numbness and tingling      PLAN: - monitor symptoms; patient has 2 nonspecific T2 hyperintensities in the brain, but not enough to meet criteria for multiple sclerosis. Patient has second autoimmune/demyelinating attack in the future or has progression on neuroimaging studies, then patient could need criteria for multiple sclerosis in the future. - may consider repeat MRI thoracic spine scan in 6 weeks depending on symptoms - then may consider follow up MRI brain scan in 6-12 months depending on symptoms - continue vitamin D  Return in about 2 months (around 02/26/2017).    Suanne Marker, MD 12/27/2016, 11:55 AM Certified in Neurology, Neurophysiology and Neuroimaging  [facility] [address], [address] [place], [place] [address] [phone]

## 2016-12-30 NOTE — Telephone Encounter (Signed)
Patients wife called and stated that the company never received the paperwork and she is requesting we send it again. Please call and advise.

## 2017-01-01 ENCOUNTER — Ambulatory Visit: Payer: Self-pay | Admitting: Diagnostic Neuroimaging

## 2017-01-14 ENCOUNTER — Ambulatory Visit: Payer: Managed Care, Other (non HMO) | Admitting: Diagnostic Neuroimaging

## 2017-02-26 ENCOUNTER — Ambulatory Visit (INDEPENDENT_AMBULATORY_CARE_PROVIDER_SITE_OTHER): Payer: Managed Care, Other (non HMO) | Admitting: Diagnostic Neuroimaging

## 2017-02-26 ENCOUNTER — Encounter: Payer: Self-pay | Admitting: Diagnostic Neuroimaging

## 2017-02-26 VITALS — BP 137/90 | HR 68 | Ht 69.0 in | Wt 159.6 lb

## 2017-02-26 DIAGNOSIS — G373 Acute transverse myelitis in demyelinating disease of central nervous system: Secondary | ICD-10-CM | POA: Diagnosis not present

## 2017-02-26 DIAGNOSIS — R9089 Other abnormal findings on diagnostic imaging of central nervous system: Secondary | ICD-10-CM | POA: Diagnosis not present

## 2017-02-26 NOTE — Progress Notes (Signed)
[facility]  PATIENT: Chad MayersBarry Lave DOB: 05-01-1971  REFERRING CLINICIAN: B Williams HISTORY FROM: patient REASON FOR VISIT: follow up   HISTORICAL  CHIEF COMPLAINT:  Chief Complaint  Patient presents with  . Follow-up  . Idiopathic transverse myelitis    Weak knees, and bee feeling on exertion , more frequent occurnencs    HISTORY OF PRESENT ILLNESS:   UPDATE 02/26/17: Since last visit, feet numbness is stable. Now with intermittent weakness and "bees" in legs sensation is new and gradually worsening, but mainly with exertion. Intermittent palpitations.   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 Now with mild residual numbness in bottom of feet. Pins and needles resolved. Saddle anesthesia improved. Mild erectile dysfunction continues.  Gait and balance improved.   PRIOR HPI (12/13/16): 46 year old right-handed male here for evaluation of lower extremity numbness. 2 weeks ago patient woke up and had sudden onset numbness and tingling in his feet. He notices mainly in the bottom of his feet. Now symptoms affect bilateral feet, up to the ankles. He is also noticed some balance and walking difficulty. He feels unsteady walking his feet. He feels as though he could roll his ankle. One week later he noticed saddle numbness, and his genital/groin area, could not feel when wiping with toilet paper. This has continued. No bowel or bladder incontinence. Patient denies any numbness or weakness from his knees up to his hips. No numbness in his chest or abdomen. No problems with his right arm. In November 2017 patient had some type of left shoulder injury when he was reaching, felt something pop, and then had significant pain in his left shoulder region. He thought he might of injured his left rotator cuff. No other preceding or prodromal injuries, accidents,  infections, change in diet or activity. Patient went to PCP, had evaluation, found to have B12 level slightly low at 221. Also had slight elevation in white blood cell count of 14.4. However no signs or symptoms of infection otherwise.   REVIEW OF SYSTEMS: Full 14 system review of systems performed and negative with exception of: walking diff.   ALLERGIES: Allergies  Allergen Reactions  . Latex Rash    HOME MEDICATIONS: Outpatient Medications Prior to Visit  Medication Sig Dispense Refill  . Cholecalciferol (VITAMIN D3) 1000 units CAPS Take by mouth.    . cyanocobalamin 500 MCG tablet Take 500 mcg by mouth. Taking every other day.    . gabapentin (NEURONTIN) 300 MG capsule Take 1 capsule (300 mg total) by mouth 3 (three) times daily. 90 capsule 6   No facility-administered medications prior to visit.     PAST MEDICAL HISTORY: Past Medical History:  Diagnosis Date  . Scoliosis     PAST SURGICAL HISTORY: No past surgical history on file.  FAMILY HISTORY: Family History  Problem Relation Age of Onset  . Ataxia Maternal Grandmother     SOCIAL HISTORY:  Social History   Social History  . Marital status: Married    Spouse name: Eileen StanfordJenna  . Number of children: 3  . Years of education: 12   Occupational History  .      [employer]   Social History Main Topics  . Smoking status: Current Every Day Smoker    Packs/day: 1.00  . Smokeless tobacco: Never Used  . Alcohol use Yes     Comment: weekend  . Drug use: No  .  Sexual activity: Not on file   Other Topics Concern  . Not on file   Social History Narrative   Lives with family   Caffeine- coffee 2 cups daily     PHYSICAL EXAM  GENERAL EXAM/CONSTITUTIONAL: Vitals:  Vitals:   02/26/17 1441  BP: 137/90  Pulse: 68  Weight: 159 lb 9.6 oz (72.4 kg)  Height: 5\' 9"  (1.753 m)   Body mass index is 23.57 kg/m. No exam data present  Patient is in no distress; well developed, nourished and groomed; neck is  supple  CARDIOVASCULAR:  Examination of carotid arteries is normal; no carotid bruits  Regular rate and rhythm, no murmurs  Examination of peripheral vascular system by observation and palpation is normal  EYES:  Ophthalmoscopic exam of optic discs and posterior segments is normal; no papilledema or hemorrhages  MUSCULOSKELETAL:  Gait, strength, tone, movements noted in Neurologic exam below  NEUROLOGIC: MENTAL STATUS:  No flowsheet data found.  awake, alert, oriented to person, place and time  recent and remote memory intact  normal attention and concentration  language fluent, comprehension intact, naming intact,   fund of knowledge appropriate  CRANIAL NERVE:   2nd - no papilledema on fundoscopic exam  2nd, 3rd, 4th, 6th - pupils equal and reactive to light, visual fields full to confrontation, extraocular muscles intact, no nystagmus  5th - facial sensation symmetric  7th - facial strength symmetric  8th - hearing intact  9th - palate elevates symmetrically, uvula midline  11th - shoulder shrug symmetric  12th - tongue protrusion midline  MOTOR:   normal bulk and tone, full strength in the BUE, BLE  SENSORY:   normal and symmetric to light touch, temperature, vibration  COORDINATION:   finger-nose-finger, fine finger movements normal  REFLEXES:   deep tendon reflexes --> BUE 2, KNEES 2, ANKLES 1  GAIT/STATION:   STABLE GAIT; ABLE TO WALK TOES, HEELS    DIAGNOSTIC DATA (LABS, IMAGING, TESTING) - I reviewed patient records, labs, notes, testing and imaging myself where available.  Lab Results  Component Value Date   WBC 12.2 (H) 12/14/2016   HGB 15.8 12/14/2016   HCT 48.6 12/14/2016   MCV 91.3 12/14/2016   PLT 244 12/14/2016      Component Value Date/Time   NA 139 12/14/2016 0021   K 3.9 12/14/2016 0021   CL 106 12/14/2016 0021   CO2 28 12/14/2016 0021   GLUCOSE 97 12/14/2016 0021   BUN 9 12/14/2016 0021   CREATININE 1.07  12/14/2016 0021   CALCIUM 8.9 12/14/2016 0021   ALBUMIN 4.2 12/14/2016 0732   ALBUMIN 4.6 12/14/2016 0436   GFRNONAA >60 12/14/2016 0021   GFRAA >60 12/14/2016 0021   No results found for: CHOL, HDL, LDLCALC, LDLDIRECT, TRIG, CHOLHDL No results found for: [id number] Lab Results  Component Value Date   VITAMINB12 >7,500 (H) 12/14/2016   Lab Results  Component Value Date   TSH 5.279 (H) 12/14/2016   Vit D, 25-Hydroxy  Date Value Ref Range Status  12/14/2016 20.4 (L) 30.0 - 100.0 ng/mL Final    Comment:    (NOTE) Vitamin D deficiency has been defined by the Institute of Medicine and an Endocrine Society practice guideline as a level of serum 25-OH vitamin D less than 20 ng/mL (1,2). The Endocrine Society went on to further define vitamin D insufficiency as a level between 21 and 29 ng/mL (2). 1. IOM (Institute of Medicine). 2010. Dietary reference   intakes for calcium and D. [place]  DC: The   [employer]. 2. Holick MF, Binkley [place], Bischoff-Ferrari HA, et al.   Evaluation, treatment, and prevention of vitamin D   deficiency: an Endocrine Society clinical practice   guideline. JCEM. 2011 Jul; 96(7):1911-30. Performed At: [facility] [address] [place], [place] [id number] Mila Homer MD WU:[id number]     Outside labs (12/04/16)  A1c 5.3  B12 221  TSH 1.359  CBC: WBC 14.4 (N66, L27, M7, E1, B0)  CMP: Cr 1.1, Na 142   12/13/16 MRI brain [I reviewed images myself and agree with interpretation. -VRP]  - 2 small FLAIR hyperintensities in the cerebral white matter. No specific demyelinating pattern. - No explanation for myelopathy.  No demyelinating pattern.  12/13/16 MRI cervical / thoracic / lumbar spine [I reviewed images myself and agree with interpretation. Suspect autoimmune or inflammatory etiology. -VRP]  - Solitary 8 mm intramedullary cord lesion at T11, indeterminate. Subtle expansion and hazy faint enhancement.  - Overall mild  degenerative changes in the cervical, thoracic, and lumbar spine which are described above. No high-grade stenosis.  12/14/16 CSF TESTING  IgG, CSF 0.0 - 8.6 mg/dL 1.5   Albumin CSF-mCnc 11 - 48 mg/dL 9    IgG (Immunoglobin G), Serum 700 - 1,600 mg/dL 956    Albumin 3.5 - 5.5 g/dL 4.6   IgG/Alb Ratio, CSF 0.00 - 0.25 0.17   CSF IgG Index 0.0 - 0.7 1.1    Comment: (NOTE)    CSF OCB: Three (3) oligoclonal bands were observed in the CSF, which were not detected in the serum sample.  CSF VDRL negative CSF lyme ab negative CSF protein 18 CSF glucose 53 CSF WBC 1 CSF RBC 29   12/14/16 LAB TESTING ANA neg, SSA/SSB neg, copper nl, vit E nl, ACE nl, HIV neg, RPR neg     ASSESSMENT AND PLAN  46 y.o. year old male here with sudden onset lower extremity numbness and weakness in the bilateral feet and ankles in April 2018, with saddle anesthesia. Patient had lower extremity weakness, left worse than right, with hyperreflexia in the upper and lower extremities, and saddle anesthesia. These signs and symptoms are concerning for lower thoracic spinal cord or conus medullaris pathology. This was confirmed on MRI thoracic spine testing. 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 Symptoms significantly improved with IV steroids.    Localization: conus medullaris syndrome  Dx: Idiopathic transverse myelitis   No diagnosis found.   PLAN:  I spent 25 minutes of face to face time with patient. Greater than 50% of time was spent in counseling and coordination of care with patient. In summary we discussed:   - monitor symptoms; patient has 2 nonspecific T2 hyperintensities in the brain, but not enough to meet criteria for multiple sclerosis. If patient has second autoimmune/demyelinating attack in the future or has progression on neuroimaging studies, then patient could need criteria for multiple sclerosis in the future. - REPEAT MRI thoracic spine scan due to increasing leg symptoms - REPEAT  MRI brain scan (follow up prior white matter lesions) - check lab testing - continue vitamin D and B12  Orders Placed This Encounter  Procedures  . MR BRAIN W WO CONTRAST  . MR THORACIC SPINE W WO CONTRAST  . Vitamin B12  . VITAMIN D 25 Hydroxy (Vit-D Deficiency, Fractures)  . Methylmalonic acid, serum  . Homocysteine  . Intrinsic Factor Antibodies  . Anti-parietal antibody   Return in about 3 months (around 05/29/2017).    VIKRAM R.  PENUMALLI, MD 02/26/2017, 3:11 PM Certified in Neurology, Neurophysiology and Neuroimaging  [facility] [address] [place], [place] [address] [phone]

## 2017-03-02 LAB — HOMOCYSTEINE: HOMOCYSTEINE: 11.5 umol/L (ref 0.0–15.0)

## 2017-03-02 LAB — INTRINSIC FACTOR ANTIBODIES: Intrinsic Factor Abs, Serum: 1 AU/mL (ref 0.0–1.1)

## 2017-03-02 LAB — VITAMIN B12: VITAMIN B 12: 602 pg/mL (ref 232–1245)

## 2017-03-02 LAB — METHYLMALONIC ACID, SERUM: Methylmalonic Acid: 120 nmol/L (ref 0–378)

## 2017-03-02 LAB — ANTI-PARIETAL ANTIBODY: Parietal Cell Ab: 4.5 Units (ref 0.0–20.0)

## 2017-03-02 LAB — VITAMIN D 25 HYDROXY (VIT D DEFICIENCY, FRACTURES): VIT D 25 HYDROXY: 24.6 ng/mL — AB (ref 30.0–100.0)

## 2017-03-06 ENCOUNTER — Telehealth: Payer: Self-pay | Admitting: *Deleted

## 2017-03-06 NOTE — Telephone Encounter (Signed)
-----   Message from Suanne MarkerVikram R Penumalli, MD sent at 03/05/2017  5:10 PM EDT ----- Unremarkable labs. Continue current plan. Please call patient. -VRP

## 2017-03-06 NOTE — Telephone Encounter (Signed)
Called and LVM for pt about unremarkable labs. Continue current plan per VP,MD. Gave [facility] phone number if he has further questions or concerns.

## 2017-03-13 ENCOUNTER — Ambulatory Visit
Admission: RE | Admit: 2017-03-13 | Discharge: 2017-03-13 | Disposition: A | Discharge status: Home / Self Care | Payer: Managed Care, Other (non HMO) | Source: Ambulatory Visit | Attending: Diagnostic Neuroimaging | Admitting: Diagnostic Neuroimaging

## 2017-03-13 DIAGNOSIS — R9089 Other abnormal findings on diagnostic imaging of central nervous system: Secondary | ICD-10-CM

## 2017-03-13 DIAGNOSIS — G373 Acute transverse myelitis in demyelinating disease of central nervous system: Secondary | ICD-10-CM | POA: Diagnosis not present

## 2017-03-13 LAB — SJOGRENS SYNDROME-A EXTRACTABLE NUCLEAR ANTIBODY

## 2017-03-13 LAB — HIV ANTIBODY (ROUTINE TESTING W REFLEX): HIV SCREEN 4TH GENERATION: NONREACTIVE

## 2017-03-13 LAB — VITAMIN E
Vitamin E (Alpha Tocopherol): 7.1 mg/L (ref 7.0–25.1)
Vitamin E(Gamma Tocopherol): 0.8 mg/L (ref 0.5–5.5)

## 2017-03-13 LAB — RPR: RPR: NONREACTIVE

## 2017-03-13 LAB — SJOGRENS SYNDROME-B EXTRACTABLE NUCLEAR ANTIBODY: SSB (La) (ENA) Antibody, IgG: <0.2 AI (ref 0.0–0.9)

## 2017-03-13 LAB — ZINC

## 2017-03-13 LAB — ANTINUCLEAR ANTIBODIES, IFA: ANTINUCLEAR ANTIBODIES, IFA: NEGATIVE

## 2017-03-13 LAB — VITAMIN D 25 HYDROXY (VIT D DEFICIENCY, FRACTURES): VIT D 25 HYDROXY: 20.4 ng/mL — AB (ref 30.0–100.0)

## 2017-03-13 LAB — COPPER, SERUM: COPPER: 81 ug/dL (ref 72–166)

## 2017-03-13 LAB — ANGIOTENSIN CONVERTING ENZYME: ANGIOTENSIN-CONVERTING ENZYME: 27 U/L (ref 14–82)

## 2017-03-13 MED ORDER — GADOBENATE DIMEGLUMINE 529 MG/ML IV SOLN
15.0000 mL | Freq: Once | INTRAVENOUS | Status: AC | PRN
  Administered 2017-03-13: 15 mL via INTRAVENOUS

## 2017-03-19 ENCOUNTER — Telehealth: Payer: Self-pay | Admitting: Diagnostic Neuroimaging

## 2017-03-19 NOTE — Telephone Encounter (Signed)
Patient calling for MRI results.

## 2017-03-24 NOTE — Telephone Encounter (Signed)
Pt is asking to be called with clarification as to what the MRI results mean. Please call

## 2017-03-24 NOTE — Telephone Encounter (Signed)
I called patient. MRI brain is stable. MRI thoracic spine shows stable plaque at T11. I do not see any major enhancement at this level. Overall no new findings.  Suanne MarkerVIKRAM R. Ilyanna Baillargeon, MD 03/24/2017, 5:25 PM Certified in Neurology, Neurophysiology and Neuroimaging  [facility] [address], [address] [place], [place] [id number] [phone]

## 2017-06-17 ENCOUNTER — Ambulatory Visit (INDEPENDENT_AMBULATORY_CARE_PROVIDER_SITE_OTHER): Payer: Managed Care, Other (non HMO) | Admitting: Diagnostic Neuroimaging

## 2017-06-17 ENCOUNTER — Encounter: Payer: Self-pay | Admitting: Diagnostic Neuroimaging

## 2017-06-17 VITALS — BP 135/79 | HR 61 | Ht 69.0 in | Wt 160.6 lb

## 2017-06-17 DIAGNOSIS — G373 Acute transverse myelitis in demyelinating disease of central nervous system: Secondary | ICD-10-CM

## 2017-06-17 DIAGNOSIS — R202 Paresthesia of skin: Secondary | ICD-10-CM | POA: Diagnosis not present

## 2017-06-17 DIAGNOSIS — R200 Anesthesia of skin: Secondary | ICD-10-CM | POA: Diagnosis not present

## 2017-06-17 MED ORDER — GABAPENTIN 300 MG PO CAPS: 300.0000 mg | ORAL_CAPSULE | Freq: Two times a day (BID) | ORAL | 12 refills | Status: DC

## 2017-06-17 NOTE — Patient Instructions (Signed)
-   continue gabapentin 300mg

## 2017-06-17 NOTE — Progress Notes (Signed)
[facility]  PATIENT: Chad MayersBarry Webb DOB: Jan 04, 1971  REFERRING CLINICIAN: B Williams HISTORY FROM: patient and wife  REASON FOR VISIT: follow up   HISTORICAL  CHIEF COMPLAINT:  Chief Complaint  Patient presents with  . Follow-up  . transmyelitis    doing ok, is taking gabapentin BID most days and TID on W/E    HISTORY OF PRESENT ILLNESS:   UPDATE (06/17/17, VRP): Since last visit, doing well. Tolerating gabapentin. No alleviating or aggravating factors. Numbness and pain in feet stable.   UPDATE 02/26/17: Since last visit, feet numbness is stable. Now with intermittent weakness and "bees" in legs sensation is new and gradually worsening, but mainly with exertion. Intermittent palpitations.   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 Now with mild residual numbness in bottom of feet. Pins and needles resolved. Saddle anesthesia improved. Mild erectile dysfunction continues.  Gait and balance improved.   PRIOR HPI (12/13/16): 46 year old right-handed male here for evaluation of lower extremity numbness. 2 weeks ago patient woke up and had sudden onset numbness and tingling in his feet. He notices mainly in the bottom of his feet. Now symptoms affect bilateral feet, up to the ankles. He is also noticed some balance and walking difficulty. He feels unsteady walking his feet. He feels as though he could roll his ankle. One week later he noticed saddle numbness, and his genital/groin area, could not feel when wiping with toilet paper. This has continued. No bowel or bladder incontinence. Patient denies any numbness or weakness from his knees up to his hips. No numbness in his chest or abdomen. No problems with his right arm. In November 2017 patient had some type of left shoulder injury when he was reaching, felt something pop, and then had significant  pain in his left shoulder region. He thought he might of injured his left rotator cuff. No other preceding or prodromal injuries, accidents, infections, change in diet or activity. Patient went to PCP, had evaluation, found to have B12 level slightly low at 221. Also had slight elevation in white blood cell count of 14.4. However no signs or symptoms of infection otherwise.   REVIEW OF SYSTEMS: Full 14 system review of systems performed and negative with exception of: walking diff.   ALLERGIES: Allergies  Allergen Reactions  . Latex Rash    HOME MEDICATIONS: Outpatient Medications Prior to Visit  Medication Sig Dispense Refill  . Cholecalciferol (VITAMIN D3) 1000 units CAPS Take by mouth.    . cyanocobalamin 500 MCG tablet Take 500 mcg by mouth. Taking every other day.    . gabapentin (NEURONTIN) 300 MG capsule Take 1 capsule (300 mg total) by mouth 3 (three) times daily. 90 capsule 6   No facility-administered medications prior to visit.     PAST MEDICAL HISTORY: Past Medical History:  Diagnosis Date  . Scoliosis     PAST SURGICAL HISTORY: No past surgical history on file.  FAMILY HISTORY: Family History  Problem Relation Age of Onset  . Ataxia Maternal Grandmother     SOCIAL HISTORY:  Social History   Socioeconomic History  . Marital status: Married    Spouse name: Eileen StanfordJenna  . Number of children: 3  . Years of education: 7412  . Highest education level: Not on file  Social Needs  . Financial resource strain: Not on file  . Food insecurity - worry: Not on file  .  Food insecurity - inability: Not on file  . Transportation needs - medical: Not on file  . Transportation needs - non-medical: Not on file  Occupational History    Comment: [employer]  Tobacco Use  . Smoking status: Current Every Day Smoker    Packs/day: 1.00  . Smokeless tobacco: Never Used  Substance and Sexual Activity  . Alcohol use: Yes    Comment: weekend  . Drug use: No  . Sexual activity: Not  on file  Other Topics Concern  . Not on file  Social History Narrative   Lives with family   Caffeine- coffee 2 cups daily     PHYSICAL EXAM  GENERAL EXAM/CONSTITUTIONAL: Vitals:  Vitals:   06/17/17 1516  BP: 135/79  Pulse: 61  Weight: 160 lb 9.6 oz (72.8 kg)  Height: 5\' 9"  (1.753 m)   Body mass index is 23.72 kg/m. No exam data present  Patient is in no distress; well developed, nourished and groomed; neck is supple  CARDIOVASCULAR:  Examination of carotid arteries is normal; no carotid bruits  Regular rate and rhythm, no murmurs  Examination of peripheral vascular system by observation and palpation is normal  EYES:  Ophthalmoscopic exam of optic discs and posterior segments is normal; no papilledema or hemorrhages  MUSCULOSKELETAL:  Gait, strength, tone, movements noted in Neurologic exam below  NEUROLOGIC: MENTAL STATUS:  No flowsheet data found.  awake, alert, oriented to person, place and time  recent and remote memory intact  normal attention and concentration  language fluent, comprehension intact, naming intact,   fund of knowledge appropriate  CRANIAL NERVE:   2nd - no papilledema on fundoscopic exam  2nd, 3rd, 4th, 6th - pupils equal and reactive to light, visual fields full to confrontation, extraocular muscles intact, no nystagmus  5th - facial sensation symmetric  7th - facial strength symmetric  8th - hearing intact  9th - palate elevates symmetrically, uvula midline  11th - shoulder shrug symmetric  12th - tongue protrusion midline  MOTOR:   normal bulk and tone, full strength in the BUE, BLE  EXCEPT KNEE EXT 4+  SENSORY:   normal and symmetric to light touch, temperature, vibration  COORDINATION:   finger-nose-finger, fine finger movements normal  REFLEXES:   deep tendon reflexes --> BUE 2, KNEES 2, ANKLES 1  GAIT/STATION:   STABLE GAIT; ABLE TO WALK TOES, HEELS    DIAGNOSTIC DATA (LABS, IMAGING,  TESTING) - I reviewed patient records, labs, notes, testing and imaging myself where available.  Lab Results  Component Value Date   WBC 12.2 (H) 12/14/2016   HGB 15.8 12/14/2016   HCT 48.6 12/14/2016   MCV 91.3 12/14/2016   PLT 244 12/14/2016      Component Value Date/Time   NA 139 12/14/2016 0021   K 3.9 12/14/2016 0021   CL 106 12/14/2016 0021   CO2 28 12/14/2016 0021   GLUCOSE 97 12/14/2016 0021   BUN 9 12/14/2016 0021   CREATININE 1.07 12/14/2016 0021   CALCIUM 8.9 12/14/2016 0021   ALBUMIN 4.2 12/14/2016 0732   ALBUMIN 4.6 12/14/2016 0436   GFRNONAA >60 12/14/2016 0021   GFRAA >60 12/14/2016 0021   No results found for: CHOL, HDL, LDLCALC, LDLDIRECT, TRIG, CHOLHDL No results found for: ZOXW9U Lab Results  Component Value Date   VITAMINB12 602 02/26/2017   Lab Results  Component Value Date   TSH 5.279 (H) 12/14/2016   Vit D, 25-Hydroxy  Date Value Ref Range Status  02/26/2017 24.6 (L)  30.0 - 100.0 ng/mL Final    Comment:    Vitamin D deficiency has been defined by the Institute of Medicine and an Endocrine Society practice guideline as a level of serum 25-OH vitamin D less than 20 ng/mL (1,2). The Endocrine Society went on to further define vitamin D insufficiency as a level between 21 and 29 ng/mL (2). 1. IOM (Institute of Medicine). 2010. Dietary reference    intakes for calcium and D. [place]: The    [employer]. 2. Holick MF, Binkley [place], Bischoff-Ferrari HA, et al.    Evaluation, treatment, and prevention of vitamin D    deficiency: an Endocrine Society clinical practice    guideline. JCEM. 2011 Jul; 96(7):1911-30.   12/14/2016 20.4 (L) 30.0 - 100.0 ng/mL Final    Comment:    (NOTE) Vitamin D deficiency has been defined by the Institute of Medicine and an Endocrine Society practice guideline as a level of serum 25-OH vitamin D less than 20 ng/mL (1,2). The Endocrine Society went on to further define vitamin D insufficiency as a  level between 21 and 29 ng/mL (2). 1. IOM (Institute of Medicine). 2010. Dietary reference   intakes for calcium and D. [place]: The   [employer]. 2. Holick MF, Binkley [employer], Bischoff-Ferrari HA, et al.   Evaluation, treatment, and prevention of vitamin D   deficiency: an Endocrine Society clinical practice   guideline. JCEM. 2011 Jul; 96(7):1911-30. Performed At: [facility] [address] [place], [place] [id number] Mila HomerHancock William F MD WG:[id number]:[phone]     Outside labs (12/04/16)  A1c 5.3  B12 221  TSH 1.359  CBC: WBC 14.4 (N66, L27, M7, E1, B0)  CMP: Cr 1.1, Na 142   12/13/16 MRI brain [I reviewed images myself and agree with interpretation. -VRP]  - 2 small FLAIR hyperintensities in the cerebral white matter. No specific demyelinating pattern. - No explanation for myelopathy.  No demyelinating pattern.  12/13/16 MRI cervical / thoracic / lumbar spine [I reviewed images myself and agree with interpretation. Suspect autoimmune or inflammatory etiology. -VRP]  - Solitary 8 mm intramedullary cord lesion at T11, indeterminate. Subtle expansion and hazy faint enhancement.  - Overall mild degenerative changes in the cervical, thoracic, and lumbar spine which are described above. No high-grade stenosis.  12/14/16 CSF TESTING  IgG, CSF 0.0 - 8.6 mg/dL 1.5   Albumin CSF-mCnc 11 - 48 mg/dL 9    IgG (Immunoglobin G), Serum 700 - 1,600 mg/dL [id number]    Albumin 3.5 - 5.5 g/dL 4.6   IgG/Alb Ratio, CSF 0.00 - 0.25 0.17   CSF IgG Index 0.0 - 0.7 1.1    Comment: (NOTE)    CSF OCB: Three (3) oligoclonal bands were observed in the CSF, which were not detected in the serum sample.  CSF VDRL negative CSF lyme ab negative CSF protein 18 CSF glucose 53 CSF WBC 1 CSF RBC 29  12/14/16 LAB TESTING ANA neg, SSA/SSB neg, copper nl, vit E nl, ACE nl, HIV neg, RPR neg  03/13/17 MRI brain  - solitary white matter hyperintensity in the right posterior frontal subcortical white  matter with the differential diagnosis discussed above. No enhancing lesions are noted. Overall no significant change compared with MRI scan of the brain dated 12/13/2016  03/13/17 MRI thoracic spine  - mild degenerative changes with focal central disc protrusion at T5-6 without significant compression. Ill-defined spinal cord hyperintensity dorsally at T11 with faint peripheral enhancement may represent demyelinating plaque. No significant change  compared with previous MRI scan dated 12/13/2016     ASSESSMENT AND PLAN  46 y.o. year old male here with sudden onset lower extremity numbness and weakness in the bilateral feet and ankles in April 2018, with saddle anesthesia. Patient had lower extremity weakness, left worse than right, with hyperreflexia in the upper and lower extremities, and saddle anesthesia. These signs and symptoms are concerning for lower thoracic spinal cord or conus medullaris pathology. This was confirmed on MRI thoracic spine testing. 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 Symptoms significantly improved with IV steroids.   Localization: conus medullaris syndrome  Dx: idiopathic transverse myelitis   1. Idiopathic transverse myelitis (HCC)   2. Numbness and tingling      PLAN:  - monitor symptoms; patient has 2 nonspecific T2 hyperintensities in the brain, but not enough to meet criteria for multiple sclerosis. If patient has second autoimmune/demyelinating attack in the future or has progression on neuroimaging studies, then patient could need criteria for multiple sclerosis in the future. - continue vitamin D and B12  Meds ordered this encounter  Medications  . gabapentin (NEURONTIN) 300 MG capsule    Sig: Take 1 capsule (300 mg total) 2 (two) times daily by mouth.    Dispense:  60 capsule    Refill:  12   Return in about 1 year (around 06/17/2018).    Suanne Marker, MD 06/17/2017, 3:38 PM Certified in Neurology, Neurophysiology and  Neuroimaging  [facility] [address], [address] [place], [place] [address] [phone]

## 2017-12-23 ENCOUNTER — Encounter: Payer: Self-pay | Admitting: Diagnostic Neuroimaging

## 2017-12-23 ENCOUNTER — Ambulatory Visit (INDEPENDENT_AMBULATORY_CARE_PROVIDER_SITE_OTHER): Payer: BLUE CROSS/BLUE SHIELD | Admitting: Diagnostic Neuroimaging

## 2017-12-23 ENCOUNTER — Telehealth: Payer: Self-pay | Admitting: Diagnostic Neuroimaging

## 2017-12-23 ENCOUNTER — Encounter

## 2017-12-23 VITALS — BP 129/88 | HR 63 | Ht 69.0 in | Wt 160.4 lb

## 2017-12-23 DIAGNOSIS — R202 Paresthesia of skin: Secondary | ICD-10-CM | POA: Diagnosis not present

## 2017-12-23 DIAGNOSIS — G373 Acute transverse myelitis in demyelinating disease of central nervous system: Secondary | ICD-10-CM

## 2017-12-23 DIAGNOSIS — R200 Anesthesia of skin: Secondary | ICD-10-CM | POA: Diagnosis not present

## 2017-12-23 MED ORDER — GABAPENTIN 300 MG PO CAPS: 300.0000 mg | ORAL_CAPSULE | Freq: Two times a day (BID) | ORAL | 12 refills | Status: AC | PRN

## 2017-12-23 NOTE — Telephone Encounter (Signed)
Patient returned my call. He is scheduled for 12/31/17 on the [facility] mobile unit.

## 2017-12-23 NOTE — Progress Notes (Signed)
[facility]  PATIENT: Chad Webb DOB: Sep 05, 1970  REFERRING CLINICIAN: B Williams HISTORY FROM: patient  REASON FOR VISIT: follow up   HISTORICAL  CHIEF COMPLAINT:  Chief Complaint  Patient presents with  . Follow-up    Pain in back radiating to L arm was seen by pcp, prednisone/ flexeril L 3 fingers, better but pointer finger still with numbness. LE still the same.  Also mentioned low WBC, pcp following.  . Idiopathic myelitis    HISTORY OF PRESENT ILLNESS:   UPDATE (12/23/17, VRP): Since last visit, was doing well until March 2019, developed left shoulder blade pain, left triceps, left hand numbness (digits 1-3). Muscle spasms in left brachioradialis. Went to PCP and had prednisone. Now symptoms improving (90% back to normal).   UPDATE (06/17/17, VRP): Since last visit, doing well. Tolerating gabapentin. No alleviating or aggravating factors. Numbness and pain in feet stable.   UPDATE 02/26/17: Since last visit, feet numbness is stable. Now with intermittent weakness and "bees" in legs sensation is new and gradually worsening, but mainly with exertion. Intermittent palpitations.   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 Now with mild residual numbness in bottom of feet. Pins and needles resolved. Saddle anesthesia improved. Mild erectile dysfunction continues.  Gait and balance improved.   PRIOR HPI (12/13/16): 47 year old right-handed male here for evaluation of lower extremity numbness. 2 weeks ago patient woke up and had sudden onset numbness and tingling in his feet. He notices mainly in the bottom of his feet. Now symptoms affect bilateral feet, up to the ankles. He is also noticed some balance and walking difficulty. He feels unsteady walking his feet. He feels as though he could roll his ankle. One week later he noticed saddle  numbness, and his genital/groin area, could not feel when wiping with toilet paper. This has continued. No bowel or bladder incontinence. Patient denies any numbness or weakness from his knees up to his hips. No numbness in his chest or abdomen. No problems with his right arm. In November 2017 patient had some type of left shoulder injury when he was reaching, felt something pop, and then had significant pain in his left shoulder region. He thought he might of injured his left rotator cuff. No other preceding or prodromal injuries, accidents, infections, change in diet or activity. Patient went to PCP, had evaluation, found to have B12 level slightly low at 221. Also had slight elevation in white blood cell count of 14.4. However no signs or symptoms of infection otherwise.   REVIEW OF SYSTEMS: Full 14 system review of systems performed and negative with exception of: joint pain aching muscles walking diff.    ALLERGIES: Allergies  Allergen Reactions  . Latex Rash    HOME MEDICATIONS: Outpatient Medications Prior to Visit  Medication Sig Dispense Refill  . Cholecalciferol (VITAMIN D3) 1000 units CAPS Take by mouth.    . cyanocobalamin 500 MCG tablet Take 500 mcg by mouth. Taking every other day.    . gabapentin (NEURONTIN) 300 MG capsule Take 1 capsule (300 mg total) 2 (two) times daily by mouth. (Patient taking differently: Take 300 mg by mouth 2 (two) times daily as needed. ) 60 capsule 12   No facility-administered medications prior to visit.     PAST MEDICAL HISTORY: Past Medical History:  Diagnosis Date  . Scoliosis     PAST SURGICAL HISTORY: No  past surgical history on file.  FAMILY HISTORY: Family History  Problem Relation Age of Onset  . Ataxia Maternal Grandmother     SOCIAL HISTORY:  Social History   Socioeconomic History  . Marital status: Married    Spouse name: Eileen Stanford  . Number of children: 3  . Years of education: 31  . Highest education level: Not on file    Occupational History    Comment: [employer]  Social Needs  . Financial resource strain: Not on file  . Food insecurity:    Worry: Not on file    Inability: Not on file  . Transportation needs:    Medical: Not on file    Non-medical: Not on file  Tobacco Use  . Smoking status: Current Every Day Smoker    Packs/day: 1.00  . Smokeless tobacco: Never Used  Substance and Sexual Activity  . Alcohol use: Yes    Comment: weekend  . Drug use: No  . Sexual activity: Not on file  Lifestyle  . Physical activity:    Days per week: Not on file    Minutes per session: Not on file  . Stress: Not on file  Relationships  . Social connections:    Talks on phone: Not on file    Gets together: Not on file    Attends religious service: Not on file    Active member of club or organization: Not on file    Attends meetings of clubs or organizations: Not on file    Relationship status: Not on file  . Intimate partner violence:    Fear of current or ex partner: Not on file    Emotionally abused: Not on file    Physically abused: Not on file    Forced sexual activity: Not on file  Other Topics Concern  . Not on file  Social History Narrative   Lives with family   Caffeine- coffee 2 cups daily     PHYSICAL EXAM  GENERAL EXAM/CONSTITUTIONAL: Vitals:  Vitals:   12/23/17 0844  BP: 129/88  Pulse: 63  Weight: 160 lb 6.4 oz (72.8 kg)  Height: 5\' 9"  (1.753 m)   Body mass index is 23.69 kg/m. No exam data present  Patient is in no distress; well developed, nourished and groomed; neck is supple  CARDIOVASCULAR:  Examination of carotid arteries is normal; no carotid bruits  Regular rate and rhythm, no murmurs  Examination of peripheral vascular system by observation and palpation is normal  EYES:  Ophthalmoscopic exam of optic discs and posterior segments is normal; no papilledema or hemorrhages  MUSCULOSKELETAL:  Gait, strength, tone, movements noted in Neurologic exam  below  NEUROLOGIC: MENTAL STATUS:  No flowsheet data found.  awake, alert, oriented to person, place and time  recent and remote memory intact  normal attention and concentration  language fluent, comprehension intact, naming intact,   fund of knowledge appropriate  CRANIAL NERVE:   2nd - no papilledema on fundoscopic exam  2nd, 3rd, 4th, 6th - pupils equal and reactive to light, visual fields full to confrontation, extraocular muscles intact, no nystagmus  5th - facial sensation symmetric  7th - facial strength symmetric  8th - hearing intact  9th - palate elevates symmetrically, uvula midline  11th - shoulder shrug symmetric  12th - tongue protrusion midline  MOTOR:   normal bulk and tone, full strength in the RUE, BLE  LUE BICEPS 4, FINGER ABDUCTION 4  SENSORY:   normal and symmetric to light touch, temperature,  vibration  EXCEPT DECR PP IN LEFT HAND  COORDINATION:   finger-nose-finger, fine finger movements normal  REFLEXES:   deep tendon reflexes --> BUE 2, KNEES 2, ANKLES 1  GAIT/STATION:   STABLE GAIT    DIAGNOSTIC DATA (LABS, IMAGING, TESTING) - I reviewed patient records, labs, notes, testing and imaging myself where available.  CBC Latest Ref Rng & Units 12/14/2016 12/14/2016 12/13/2016  WBC 4.0 - 10.5 K/uL 12.2(H) - 13.6(H)  Hemoglobin 13.0 - 17.0 g/dL 16.1 - 09.6  Hematocrit 37.5 - 51.0 % 48.6 45.4 48.1  Platelets 150 - 400 K/uL 244 - 256   Lab Results  Component Value Date   WBC 12.2 (H) 12/14/2016   HGB 15.8 12/14/2016   HCT 48.6 12/14/2016   MCV 91.3 12/14/2016   PLT 244 12/14/2016      Component Value Date/Time   NA 139 12/14/2016 0021   K 3.9 12/14/2016 0021   CL 106 12/14/2016 0021   CO2 28 12/14/2016 0021   GLUCOSE 97 12/14/2016 0021   BUN 9 12/14/2016 0021   CREATININE 1.07 12/14/2016 0021   CALCIUM 8.9 12/14/2016 0021   ALBUMIN 4.2 12/14/2016 0732   ALBUMIN 4.6 12/14/2016 0436   GFRNONAA >60 12/14/2016 0021    GFRAA >60 12/14/2016 0021   No results found for: CHOL, HDL, LDLCALC, LDLDIRECT, TRIG, CHOLHDL No results found for: EAVW0J Lab Results  Component Value Date   VITAMINB12 602 02/26/2017   Lab Results  Component Value Date   TSH 5.279 (H) 12/14/2016   Vit D, 25-Hydroxy  Date Value Ref Range Status  02/26/2017 24.6 (L) 30.0 - 100.0 ng/mL Final    Comment:    Vitamin D deficiency has been defined by the Institute of Medicine and an Endocrine Society practice guideline as a level of serum 25-OH vitamin D less than 20 ng/mL (1,2). The Endocrine Society went on to further define vitamin D insufficiency as a level between 21 and 29 ng/mL (2). 1. IOM (Institute of Medicine). 2010. Dietary reference    intakes for calcium and D. [place]: The    [employer]. 2. Holick MF, [place]inkley [place], Bischoff-Ferrari HA, et al.    Evaluation, treatment, and prevention of vitamin D    deficiency: an Endocrine Society clinical practice    guideline. JCEM. 2011 Jul; 96(7):1911-30.   12/14/2016 20.4 (L) 30.0 - 100.0 ng/mL Final    Comment:    (NOTE) Vitamin D deficiency has been defined by the Institute of Medicine and an Endocrine Society practice guideline as a level of serum 25-OH vitamin D less than 20 ng/mL (1,2). The Endocrine Society went on to further define vitamin D insufficiency as a level between 21 and 29 ng/mL (2). 1. IOM (Institute of Medicine). 2010. Dietary reference   intakes for calcium and D. [place]: The   [employer]. 2. Holick MF, Binkley [place], Bischoff-Ferrari HA, et al.   Evaluation, treatment, and prevention of vitamin D   deficiency: an Endocrine Society clinical practice   guideline. JCEM. 2011 Jul; 96(7):1911-30. Performed At: [facility] [address] [place], [place] [id number] Mila Homer MD NF:[id number]     Outside labs (12/04/16)  A1c 5.3  B12 221  TSH 1.359  CBC: WBC 14.4 (N66, L27, M7, E1, B0)  CMP: Cr  1.1, Na 142  12/14/16 CSF TESTING  IgG, CSF 0.0 - 8.6 mg/dL 1.5   Albumin CSF-mCnc 11 - 48 mg/dL 9    IgG (Immunoglobin G), Serum 700 -  1,600 mg/dL 161    Albumin 3.5 - 5.5 g/dL 4.6   IgG/Alb Ratio, CSF 0.00 - 0.25 0.17   CSF IgG Index 0.0 - 0.7 1.1    Comment: (NOTE)    CSF OCB: Three (3) oligoclonal bands were observed in the CSF, which were not detected in the serum sample.  CSF VDRL negative CSF lyme ab negative CSF protein 18 CSF glucose 53 CSF WBC 1 CSF RBC 29  12/14/16 LAB TESTING ANA neg, SSA/SSB neg, copper nl, vit E nl, ACE nl, HIV neg, RPR neg  12/13/16 MRI brain [I reviewed images myself and agree with interpretation. -VRP]  - 2 small FLAIR hyperintensities in the cerebral white matter. No specific demyelinating pattern. - No explanation for myelopathy.  No demyelinating pattern.  12/13/16 MRI cervical / thoracic / lumbar spine [I reviewed images myself and agree with interpretation. Suspect autoimmune or inflammatory etiology. -VRP]  - Solitary 8 mm intramedullary cord lesion at T11, indeterminate. Subtle expansion and hazy faint enhancement.  - Overall mild degenerative changes in the cervical, thoracic, and lumbar spine which are described above. No high-grade stenosis.  03/13/17 MRI brain  - solitary white matter hyperintensity in the right posterior frontal subcortical white matter with the differential diagnosis discussed above. No enhancing lesions are noted. Overall no significant change compared with MRI scan of the brain dated 12/13/2016  03/13/17 MRI thoracic spine  - mild degenerative changes with focal central disc protrusion at T5-6 without significant compression. Ill-defined spinal cord hyperintensity dorsally at T11 with faint peripheral enhancement may represent demyelinating plaque. No significant change compared with previous MRI scan dated 12/13/2016     ASSESSMENT AND PLAN  47 y.o. year old male here with sudden onset lower extremity numbness and weakness  in the bilateral feet and ankles in April 2018, with saddle anesthesia. Patient had lower extremity weakness, left worse than right, with hyperreflexia in the upper and lower extremities, and saddle anesthesia. These signs and symptoms are concerning for lower thoracic spinal cord or conus medullaris pathology. This was confirmed on MRI thoracic spine testing. 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 Symptoms significantly improved with IV steroids.  Now with new left arm numbness and weakness in March 2019. Could represent cervical spinal cord lesion vs peripheral neuropathy.    Dx: idiopathic transverse myelitis   1. Idiopathic transverse myelitis (HCC)   2. Numbness and tingling in left hand   3. Numbness and tingling      PLAN:  - check MRI cervical spine (new symptoms in neck, left scapula, left arm, left hand)  - check EMG/NCS (rule out carpal tunnel syndrome)  - If patient has second autoimmune/demyelinating attack in the future or has progression on neuroimaging studies, then patient could need criteria for multiple sclerosis in the future.  - continue vitamin D and B12 replacement  Orders Placed This Encounter  Procedures  . MR CERVICAL SPINE W WO CONTRAST  . NCV with EMG(electromyography)   Meds ordered this encounter  Medications  . gabapentin (NEURONTIN) 300 MG capsule    Sig: Take 1 capsule (300 mg total) by mouth 2 (two) times daily as needed.    Dispense:  60 capsule    Refill:  12   Return in about 3 months (around 03/25/2018).  I reviewed images, labs, notes, records myself. I summarized findings and reviewed with patient, for this high risk condition (new onset weakness / numbness in left hand) requiring high complexity decision making.    Suanne Marker,  MD 12/23/2017, 9:24 AM Certified in Neurology, Neurophysiology and Neuroimaging  [facility] [address] [place], [place] [address] [phone]

## 2017-12-23 NOTE — Telephone Encounter (Signed)
[employer] [place] Auth: [id number] (exp. 12/23/17 to 02/06/18)  lvm for pt to call back about scheduling mri

## 2017-12-23 NOTE — Patient Instructions (Signed)
-   check MRI cervical spine and EMG (nerve testing)  - continue vitamin D and B12 replacement

## 2017-12-31 ENCOUNTER — Ambulatory Visit: Payer: BLUE CROSS/BLUE SHIELD

## 2017-12-31 DIAGNOSIS — R200 Anesthesia of skin: Secondary | ICD-10-CM | POA: Diagnosis not present

## 2017-12-31 DIAGNOSIS — R202 Paresthesia of skin: Secondary | ICD-10-CM | POA: Diagnosis not present

## 2017-12-31 MED ORDER — GADOPENTETATE DIMEGLUMINE 469.01 MG/ML IV SOLN
15.0000 mL | Freq: Once | INTRAVENOUS | Status: AC | PRN
  Administered 2017-12-31: 15 mL via INTRAVENOUS

## 2018-01-08 ENCOUNTER — Telehealth: Payer: Self-pay | Admitting: Diagnostic Neuroimaging

## 2018-01-08 NOTE — Telephone Encounter (Signed)
error 

## 2018-01-15 ENCOUNTER — Encounter (INDEPENDENT_AMBULATORY_CARE_PROVIDER_SITE_OTHER): Payer: BLUE CROSS/BLUE SHIELD | Admitting: Diagnostic Neuroimaging

## 2018-01-15 ENCOUNTER — Ambulatory Visit (INDEPENDENT_AMBULATORY_CARE_PROVIDER_SITE_OTHER): Payer: BLUE CROSS/BLUE SHIELD | Admitting: Diagnostic Neuroimaging

## 2018-01-15 DIAGNOSIS — R202 Paresthesia of skin: Secondary | ICD-10-CM

## 2018-01-15 DIAGNOSIS — R200 Anesthesia of skin: Secondary | ICD-10-CM

## 2018-01-15 DIAGNOSIS — Z0289 Encounter for other administrative examinations: Secondary | ICD-10-CM

## 2018-01-16 NOTE — Procedures (Signed)
   [facility]  NCS (NERVE CONDUCTION STUDY) WITH EMG (ELECTROMYOGRAPHY) REPORT   STUDY DATE: 01/15/18 PATIENT NAME: Chad Webb DOB: 09-27-1970 MRN: [id number]  ORDERING CLINICIAN: Joycelyn SchmidVikram Penumalli, MD   TECHNOLOGIST: Charlesetta IvoryBeau Handy ELECTROMYOGRAPHER: Glenford BayleyVikram R. Penumalli, MD  CLINICAL INFORMATION: 47 year old male with left arm numbness.  FINDINGS: NERVE CONDUCTION STUDY: Bilateral median and left ulnar motor responses are normal.  Left median sensory response is normal.  Right median sensory response has slightly prolonged peak latency.    Bilateral ulnar sensory responses normal.  Left median to ulnar transcarpal mixed nerve comparison shows slightly prolonged peak latency difference.  Right median to ulnar transcarpal mixed nerve comparison is normal.   NEEDLE ELECTROMYOGRAPHY: Needle examination of left upper extremity is normal.   IMPRESSION:   Slightly abnormal study demonstrating: - Mild bilateral median neuropathies at the wrist consistent with mild bilateral carpal tunnel syndrome.    INTERPRETING PHYSICIAN:  Suanne MarkerVIKRAM R. PENUMALLI, MD Certified in Neurology, Neurophysiology and Neuroimaging  [facility] [address], [address] [place], [place] [id number] [phone]

## 2018-03-25 ENCOUNTER — Ambulatory Visit (INDEPENDENT_AMBULATORY_CARE_PROVIDER_SITE_OTHER): Payer: BLUE CROSS/BLUE SHIELD | Admitting: Diagnostic Neuroimaging

## 2018-03-25 ENCOUNTER — Encounter: Payer: Self-pay | Admitting: Diagnostic Neuroimaging

## 2018-03-25 VITALS — BP 117/82 | HR 64 | Ht 69.0 in | Wt 161.2 lb

## 2018-03-25 DIAGNOSIS — Z720 Tobacco use: Secondary | ICD-10-CM | POA: Diagnosis not present

## 2018-03-25 DIAGNOSIS — G5603 Carpal tunnel syndrome, bilateral upper limbs: Secondary | ICD-10-CM | POA: Diagnosis not present

## 2018-03-25 DIAGNOSIS — G373 Acute transverse myelitis in demyelinating disease of central nervous system: Secondary | ICD-10-CM | POA: Insufficient documentation

## 2018-03-25 NOTE — Progress Notes (Signed)
[facility]  PATIENT: Chad Webb DOB: Jan 24, 1971  REFERRING CLINICIAN: B Williams HISTORY FROM: patient  REASON FOR VISIT: follow up   HISTORICAL  CHIEF COMPLAINT:  Chief Complaint  Patient presents with  . Transverse myelitis    rm 7, "doing well, some days better than other; no new concerns"  . Follow-up    3 month    HISTORY OF PRESENT ILLNESS:   UPDATE (03/25/18, VRP): Since last visit, doing well. Symptoms are mild. Severity is mild. No alleviating or aggravating factors. Tolerating vitamins. Still smoking cigs. No further attacks or neuro symptoms.    UPDATE (12/23/17, VRP): Since last visit, was doing well until March 2019, developed left shoulder blade pain, left triceps, left hand numbness (digits 1-3). Muscle spasms in left brachioradialis. Went to PCP and had prednisone. Now symptoms improving (90% back to normal).   UPDATE (06/17/17, VRP): Since last visit, doing well. Tolerating gabapentin. No alleviating or aggravating factors. Numbness and pain in feet stable.   UPDATE 02/26/17: Since last visit, feet numbness is stable. Now with intermittent weakness and "bees" in legs sensation is new and gradually worsening, but mainly with exertion. Intermittent palpitations.   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 Now with mild residual numbness in bottom of feet. Pins and needles resolved. Saddle anesthesia improved. Mild erectile dysfunction continues.  Gait and balance improved.   PRIOR HPI (12/13/16): 47 year old right-handed male here for evaluation of lower extremity numbness. 2 weeks ago patient woke up and had sudden onset numbness and tingling in his feet. He notices mainly in the bottom of his feet. Now symptoms affect bilateral feet, up to the ankles. He is also noticed some balance and walking difficulty. He feels unsteady  walking his feet. He feels as though he could roll his ankle. One week later he noticed saddle numbness, and his genital/groin area, could not feel when wiping with toilet paper. This has continued. No bowel or bladder incontinence. Patient denies any numbness or weakness from his knees up to his hips. No numbness in his chest or abdomen. No problems with his right arm. In November 2017 patient had some type of left shoulder injury when he was reaching, felt something pop, and then had significant pain in his left shoulder region. He thought he might of injured his left rotator cuff. No other preceding or prodromal injuries, accidents, infections, change in diet or activity. Patient went to PCP, had evaluation, found to have B12 level slightly low at 221. Also had slight elevation in white blood cell count of 14.4. However no signs or symptoms of infection otherwise.   REVIEW OF SYSTEMS: Full 14 system review of systems performed and negative with exception of: joint pain aching muscles walking diff.    ALLERGIES: Allergies  Allergen Reactions  . Latex Rash    HOME MEDICATIONS: Outpatient Medications Prior to Visit  Medication Sig Dispense Refill  . Cholecalciferol (VITAMIN D3) 1000 units CAPS Take by mouth.    . cyanocobalamin 500 MCG tablet Take 500 mcg by mouth. Taking every other day.    . gabapentin (NEURONTIN) 300 MG capsule Take 1 capsule (300 mg total) by mouth 2 (two) times daily as needed. 60 capsule 12   No facility-administered medications prior to visit.     PAST MEDICAL HISTORY: Past Medical History:  Diagnosis Date  . Scoliosis     PAST  SURGICAL HISTORY: No past surgical history on file.  FAMILY HISTORY: Family History  Problem Relation Age of Onset  . Ataxia Maternal Grandmother     SOCIAL HISTORY:  Social History   Socioeconomic History  . Marital status: Married    Spouse name: Eileen Stanford  . Number of children: 3  . Years of education: 79  . Highest  education level: Not on file  Occupational History    Comment: [employer]  Social Needs  . Financial resource strain: Not on file  . Food insecurity:    Worry: Not on file    Inability: Not on file  . Transportation needs:    Medical: Not on file    Non-medical: Not on file  Tobacco Use  . Smoking status: Current Every Day Smoker    Packs/day: 1.00  . Smokeless tobacco: Never Used  Substance and Sexual Activity  . Alcohol use: Yes    Comment: weekend  . Drug use: No  . Sexual activity: Not on file  Lifestyle  . Physical activity:    Days per week: Not on file    Minutes per session: Not on file  . Stress: Not on file  Relationships  . Social connections:    Talks on phone: Not on file    Gets together: Not on file    Attends religious service: Not on file    Active member of club or organization: Not on file    Attends meetings of clubs or organizations: Not on file    Relationship status: Not on file  . Intimate partner violence:    Fear of current or ex partner: Not on file    Emotionally abused: Not on file    Physically abused: Not on file    Forced sexual activity: Not on file  Other Topics Concern  . Not on file  Social History Narrative   Lives with family   Caffeine- coffee 2 cups daily     PHYSICAL EXAM  GENERAL EXAM/CONSTITUTIONAL: Vitals:  Vitals:   03/25/18 1246  BP: 117/82  Pulse: 64  Weight: 161 lb 3.2 oz (73.1 kg)  Height: 5\' 9"  (1.753 m)     Body mass index is 23.81 kg/m. Wt Readings from Last 3 Encounters:  03/25/18 161 lb 3.2 oz (73.1 kg)  12/23/17 160 lb 6.4 oz (72.8 kg)  06/17/17 160 lb 9.6 oz (72.8 kg)     Patient is in no distress; well developed, nourished and groomed; neck is supple  CARDIOVASCULAR:  Examination of carotid arteries is normal; no carotid bruits  Regular rate and rhythm, no murmurs  Examination of peripheral vascular system by observation and palpation is normal  EYES:  Ophthalmoscopic exam of optic  discs and posterior segments is normal; no papilledema or hemorrhages  No exam data present  MUSCULOSKELETAL:  Gait, strength, tone, movements noted in Neurologic exam below  NEUROLOGIC: MENTAL STATUS:  No flowsheet data found.  awake, alert, oriented to person, place and time  recent and remote memory intact  normal attention and concentration  language fluent, comprehension intact, naming intact  fund of knowledge appropriate  CRANIAL NERVE:   2nd - no papilledema on fundoscopic exam  2nd, 3rd, 4th, 6th - pupils equal and reactive to light, visual fields full to confrontation, extraocular muscles intact, no nystagmus; MILD NYSTAGMUS ON END GAZE  5th - facial sensation symmetric  7th - facial strength symmetric  8th - hearing intact  9th - palate elevates symmetrically, uvula midline  11th -  shoulder shrug symmetric  12th - tongue protrusion midline  MOTOR:   normal bulk and tone, full strength in the BUE, BLE  SENSORY:   normal and symmetric to light touch, temperature, vibration  COORDINATION:   finger-nose-finger, fine finger movements --> MILD DYSMETRIA WITH LUE  REFLEXES:   deep tendon reflexes present and symmetric  GAIT/STATION:   narrow based gait; SLIGHT DIFF WITH TANDEM     DIAGNOSTIC DATA (LABS, IMAGING, TESTING) - I reviewed patient records, labs, notes, testing and imaging myself where available.  CBC Latest Ref Rng & Units 12/14/2016 12/14/2016 12/13/2016  WBC 4.0 - 10.5 K/uL 12.2(H) - 13.6(H)  Hemoglobin 13.0 - 17.0 g/dL 65.715.8 - 84.616.8  Hematocrit 37.5 - 51.0 % 48.6 45.4 48.1  Platelets 150 - 400 K/uL 244 - 256   Lab Results  Component Value Date   WBC 12.2 (H) 12/14/2016   HGB 15.8 12/14/2016   HCT 48.6 12/14/2016   MCV 91.3 12/14/2016   PLT 244 12/14/2016      Component Value Date/Time   NA 139 12/14/2016 0021   K 3.9 12/14/2016 0021   CL 106 12/14/2016 0021   CO2 28 12/14/2016 0021   GLUCOSE 97 12/14/2016 0021   BUN 9  12/14/2016 0021   CREATININE 1.07 12/14/2016 0021   CALCIUM 8.9 12/14/2016 0021   ALBUMIN 4.2 12/14/2016 0732   ALBUMIN 4.6 12/14/2016 0436   GFRNONAA >60 12/14/2016 0021   GFRAA >60 12/14/2016 0021   No results found for: CHOL, HDL, LDLCALC, LDLDIRECT, TRIG, CHOLHDL No results found for: NGEX5MHGBA1C Lab Results  Component Value Date   VITAMINB12 602 02/26/2017   Lab Results  Component Value Date   TSH 5.279 (H) 12/14/2016   Vit D, 25-Hydroxy  Date Value Ref Range Status  02/26/2017 24.6 (L) 30.0 - 100.0 ng/mL Final    Comment:    Vitamin D deficiency has been defined by the Institute of Medicine and an Endocrine Society practice guideline as a level of serum 25-OH vitamin D less than 20 ng/mL (1,2). The Endocrine Society went on to further define vitamin D insufficiency as a level between 21 and 29 ng/mL (2). 1. IOM (Institute of Medicine). 2010. Dietary reference    intakes for calcium and D. [place]: The    [employer]. 2. Holick MF, Binkley Groom, Bischoff-Ferrari HA, et al.    Evaluation, treatment, and prevention of vitamin D    deficiency: an Endocrine Society clinical practice    guideline. JCEM. 2011 Jul; 96(7):1911-30.   12/14/2016 20.4 (L) 30.0 - 100.0 ng/mL Final    Comment:    (NOTE) Vitamin D deficiency has been defined by the Institute of Medicine and an Endocrine Society practice guideline as a level of serum 25-OH vitamin D less than 20 ng/mL (1,2). The Endocrine Society went on to further define vitamin D insufficiency as a level between 21 and 29 ng/mL (2). 1. IOM (Institute of Medicine). 2010. Dietary reference   intakes for calcium and D. [place]: The   [employer]. 2. Holick MF, Binkley Spangle, Bischoff-Ferrari HA, et al.   Evaluation, treatment, and prevention of vitamin D   deficiency: an Endocrine Society clinical practice   guideline. JCEM. 2011 Jul; 96(7):1911-30. Performed At: [facility] [address] [place], [place] [id number] Mila HomerHancock William F MD UU:[id number]:[phone]     Outside labs (12/04/16)  A1c 5.3  B12 221  TSH 1.359  CBC: WBC 14.4 (N66, L27, M7, E1, B0)  CMP:  Cr 1.1, Na 142  12/14/16 CSF TESTING  IgG, CSF 0.0 - 8.6 mg/dL 1.5   Albumin CSF-mCnc 11 - 48 mg/dL 9    IgG (Immunoglobin G), Serum 700 - 1,600 mg/dL 409    Albumin 3.5 - 5.5 g/dL 4.6   IgG/Alb Ratio, CSF 0.00 - 0.25 0.17   CSF IgG Index 0.0 - 0.7 1.1    Comment: (NOTE)    CSF OCB: Three (3) oligoclonal bands were observed in the CSF, which were not detected in the serum sample.  CSF VDRL negative CSF lyme ab negative CSF protein 18 CSF glucose 53 CSF WBC 1 CSF RBC 29  12/14/16 LAB TESTING ANA neg, SSA/SSB neg, copper nl, vit E nl, ACE nl, HIV neg, RPR neg  12/13/16 MRI brain [I reviewed images myself and agree with interpretation. -VRP]  - 2 small FLAIR hyperintensities in the cerebral white matter. No specific demyelinating pattern. - No explanation for myelopathy.  No demyelinating pattern.  12/13/16 MRI cervical / thoracic / lumbar spine [I reviewed images myself and agree with interpretation. Suspect autoimmune or inflammatory etiology. -VRP]  - Solitary 8 mm intramedullary cord lesion at T11, indeterminate. Subtle expansion and hazy faint enhancement.  - Overall mild degenerative changes in the cervical, thoracic, and lumbar spine which are described above. No high-grade stenosis.  03/13/17 MRI brain  - solitary white matter hyperintensity in the right posterior frontal subcortical white matter with the differential diagnosis discussed above. No enhancing lesions are noted. Overall no significant change compared with MRI scan of the brain dated 12/13/2016  03/13/17 MRI thoracic spine  - mild degenerative changes with focal central disc protrusion at T5-6 without significant compression. Ill-defined spinal cord hyperintensity dorsally at T11 with faint peripheral enhancement may represent demyelinating plaque.  No significant change compared with previous MRI scan dated 12/13/2016  12/31/17 MRI cervical spine (with and without) [I reviewed images myself and agree with interpretation. -VRP]  - Mild disc bulging at C3-4, C5-6 and C6-7. - No definite intrinsic or abnormal enhancing spinal cord lesions. - No change from MRI on 12/13/16.   01/15/18 EMG/NCS - Mild bilateral median neuropathies at the wrist consistent with mild bilateral carpal tunnel syndrome.      ASSESSMENT AND PLAN  47 y.o. year old male here with sudden onset lower extremity numbness and weakness in the bilateral feet and ankles in April 2018, with saddle anesthesia. Patient had lower extremity weakness, left worse than right, with hyperreflexia in the upper and lower extremities, and saddle anesthesia. These signs and symptoms are concerning for lower thoracic spinal cord or conus medullaris pathology. This was confirmed on MRI thoracic spine testing. 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 Symptoms significantly improved with IV steroids.  Now with new left arm numbness and weakness in March 2019. Could represent cervical spinal cord lesion vs peripheral neuropathy.    Dx: idiopathic transverse myelitis + carpal tunnel syndrome  1. Idiopathic transverse myelitis (HCC)   2. Bilateral carpal tunnel syndrome   3. Tobacco abuse      PLAN:  IDIOPATHIC TRANSVERSE MYELITIS (T11 level) - stable; monitor symptoms - continue vitamin supplements (vitamin D and B12 replacement); follow up with PCP - If patient has second autoimmune/demyelinating attack in the future or has progression on neuroimaging studies, then patient could need criteria for multiple sclerosis in the future.  CARPAL TUNNEL SYNDROME - stable; monitor  TOBACCO ABUSE - counseled on smoking cessation  Return if symptoms worsen or fail to improve, for return to  PCP. return as needed to neurology clinic   Suanne MarkerVIKRAM R. PENUMALLI, MD 03/25/2018, 1:09  PM Certified in Neurology, Neurophysiology and Neuroimaging  [facility] [address], [address] [place], [place] [address] [phone]

## 2018-06-17 ENCOUNTER — Ambulatory Visit: Payer: Managed Care, Other (non HMO) | Admitting: Diagnostic Neuroimaging

## 2019-01-04 IMAGING — MR MR HEAD WO/W CM
23 of 36 series · 29 of 48 positions shown · IV contrast (15    MULTIHANCE)
Comparison: None.

CLINICAL DATA: Myelopathy.

EXAM:
MRI HEAD WITHOUT AND WITH CONTRAST
TECHNIQUE: Multiplanar, multiecho pulse sequences of the brain and surrounding
structures were obtained without and with intravenous contrast.
CONTRAST:  15mL MULTIHANCE GADOBENATE DIMEGLUMINE 529 MG/ML IV SOLN

[Series 3: T1 · sagittal · 5.0mm · 0.47mm/px · 1 of 23 slices shown (1 of 4)]
[im 1/23]
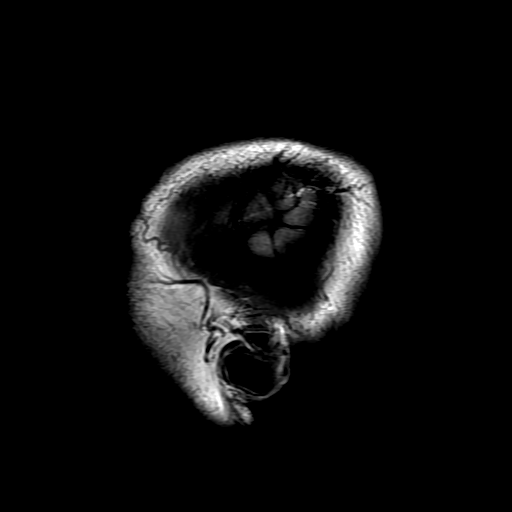

[Series 4: DWI · axial · 3.0mm · 1.09mm/px · z∈[-72,+77]mm · 3 of 102 slices shown (1 of 4)]
[im 1/102]
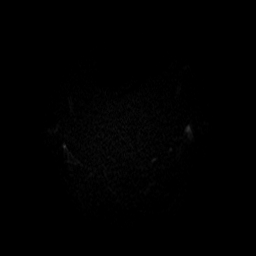
[im 51/102]
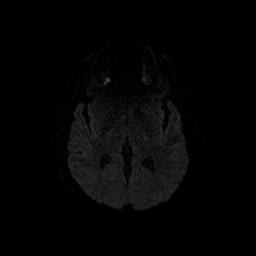
[im 102/102]
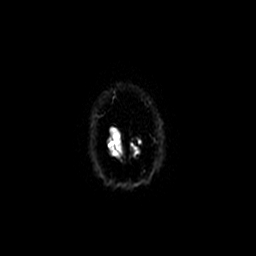

[Series 5: T2 · axial · 5.0mm · 0.43mm/px · 1 of 27 slices shown (1 of 7)]
[im 1/27]
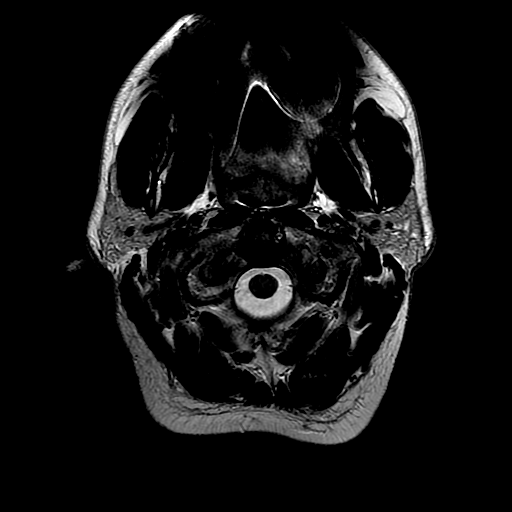

[Series 6: DWI · coronal · 5.0mm · 1.09mm/px · 2 of 68 slices shown (2 of 4)]
[im 1/68]
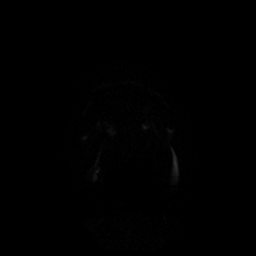
[im 68/68]
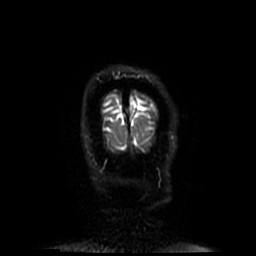

[Series 7: FLAIR · axial · 5.0mm · 0.43mm/px · 1 of 27 slices shown]
[im 1/27]
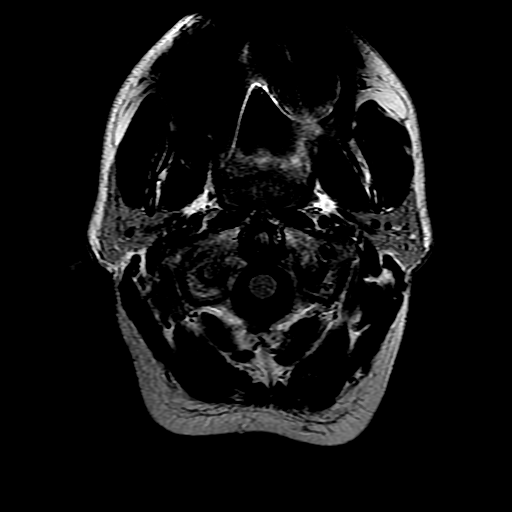

[Series 10: T2 post-contrast · coronal · 5.0mm · 0.39mm/px · 1 of 26 slices shown]
[im 1/26]
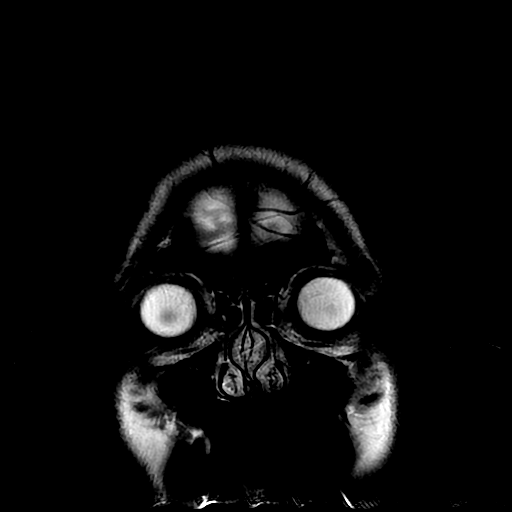

[Series 12: T2 · sagittal · 3.0mm · 0.43mm/px · 1 of 15 slices shown (2 of 7)]
[im 1/15]
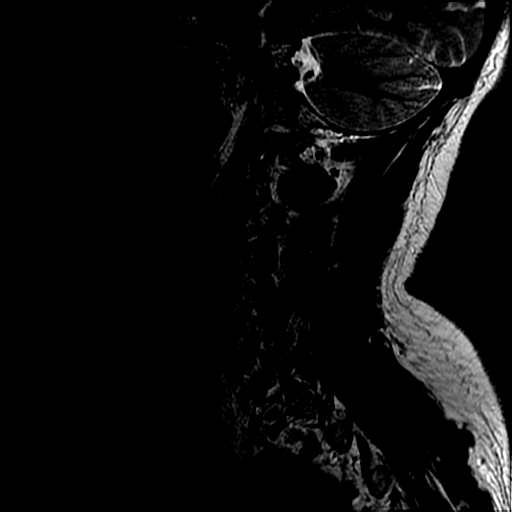

[Series 13: T1 · sagittal · 3.0mm · 0.43mm/px · 1 of 15 slices shown (2 of 4)]
[im 1/15]
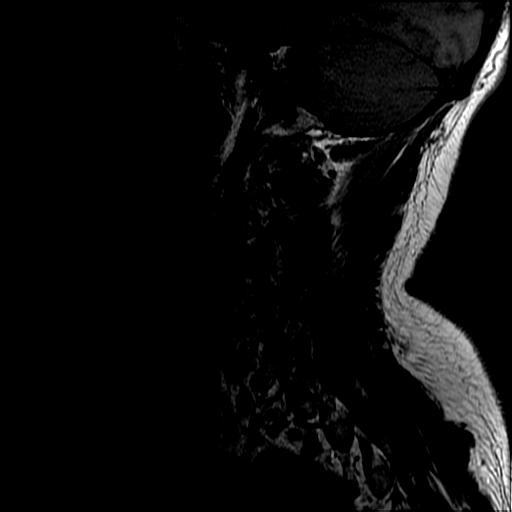

[Series 14: T2 · axial · 3.0mm · 0.39mm/px · 1 of 31 slices shown (3 of 7)]
[im 1/31]
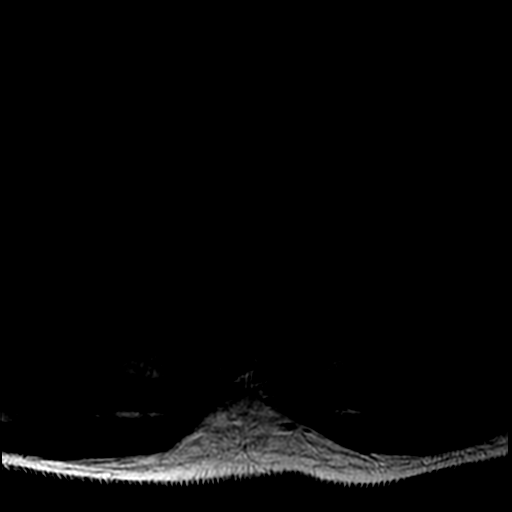

[Series 17: T1 · axial · non-contrast · 3.0mm · 0.78mm/px · 1 of 31 slices shown (3 of 4)]
[im 1/31]
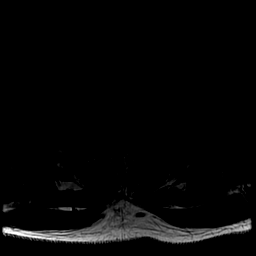

[Series 21: T2 · sagittal · 4.0mm · 0.55mm/px · 1 of 13 slices shown (4 of 7)]
[im 1/13]
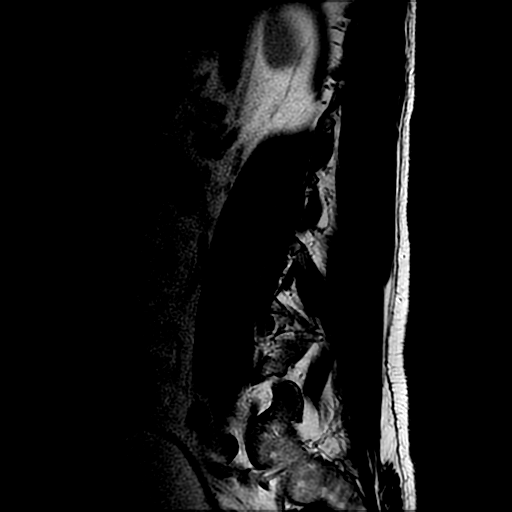

[Series 22: T1 · sagittal · 4.0mm · 0.55mm/px · 1 of 13 slices shown (4 of 4)]
[im 1/13]
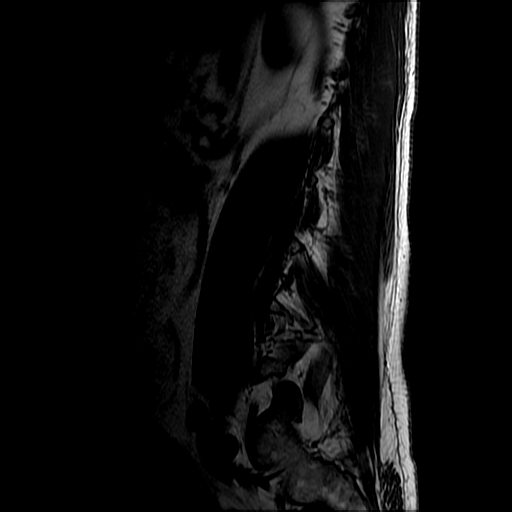

[Series 23: T2 · axial · 4.0mm · 0.39mm/px · 1 of 33 slices shown (5 of 7)]
[im 1/33]
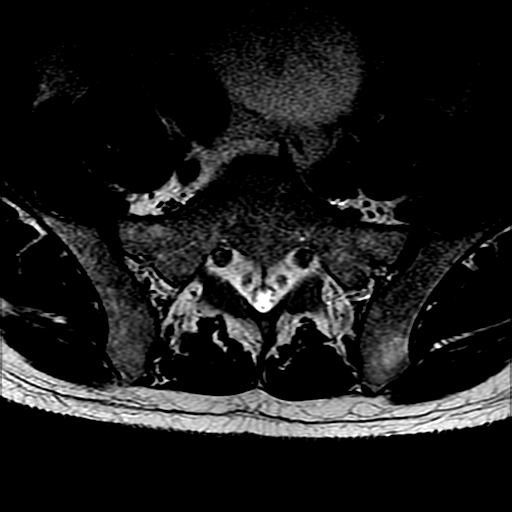

[Series 29: T2 · sagittal · 3.0mm · 0.62mm/px · 1 of 15 slices shown (6 of 7)]
[im 1/15]
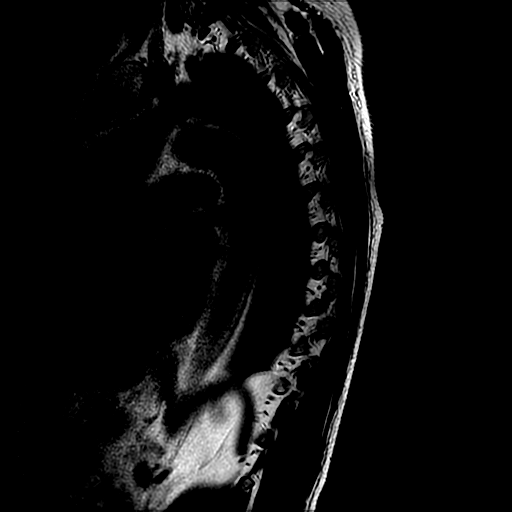

[Series 32: T2 · axial · 4.0mm · 0.86mm/px · z∈[-408,-239]mm · 2 of 40 slices shown (7 of 7)]
[im 1/40]
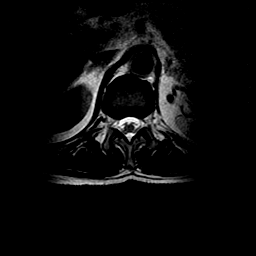
[im 40/40]
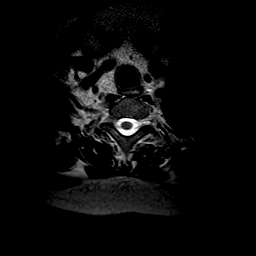

[Series 35: T1 post-contrast · sagittal · 3.0mm · 0.62mm/px · 1 of 15 slices shown (1 of 6)]
[im 1/15]
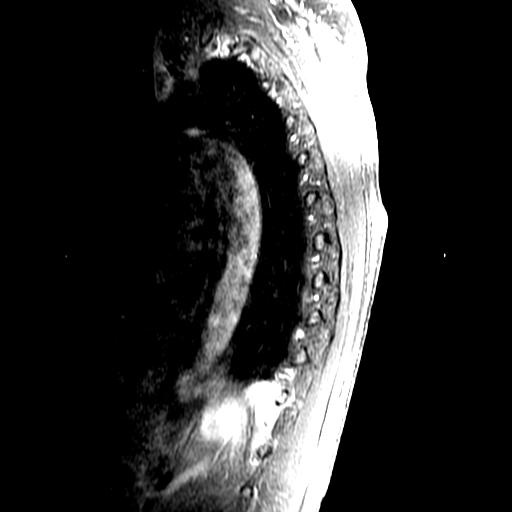

[Series 36: T1 post-contrast · axial · 4.0mm · 0.86mm/px · z∈[-408,-239]mm · 2 of 40 slices shown (2 of 6)]
[im 1/40]
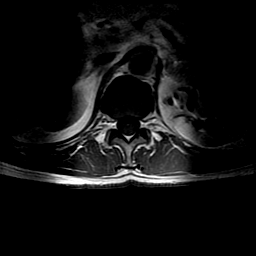
[im 40/40]
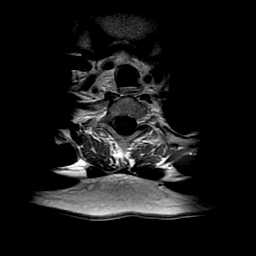

[Series 39: T1 post-contrast · coronal · 5.0mm · 0.39mm/px · 1 of 26 slices shown (3 of 6)]
[im 1/26]
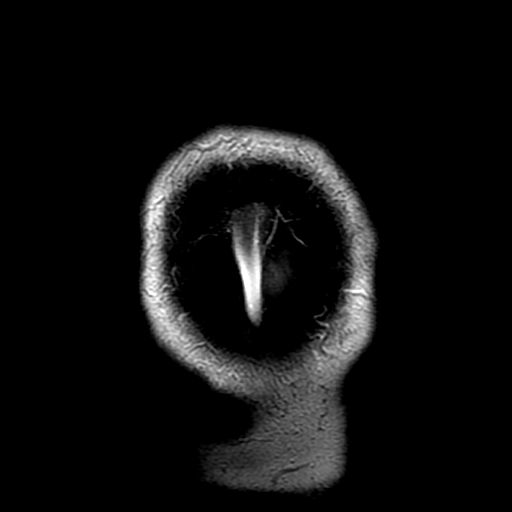

[Series 42: T1 post-contrast · axial · 3.0mm · 0.78mm/px · 1 of 16 slices shown (4 of 6)]
[im 1/16]
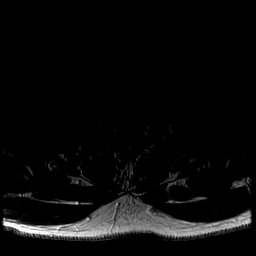

[Series 44: T1 post-contrast · sagittal · 4.0mm · 0.55mm/px · 1 of 13 slices shown (5 of 6)]
[im 1/13]
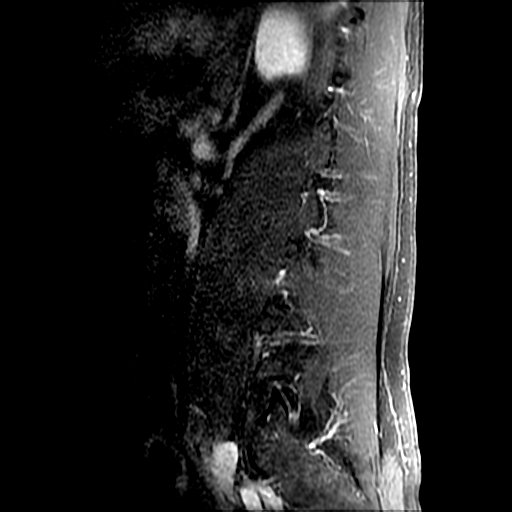

[Series 45: T1 post-contrast · axial · 4.0mm · 0.39mm/px · 1 of 33 slices shown (6 of 6)]
[im 1/33]
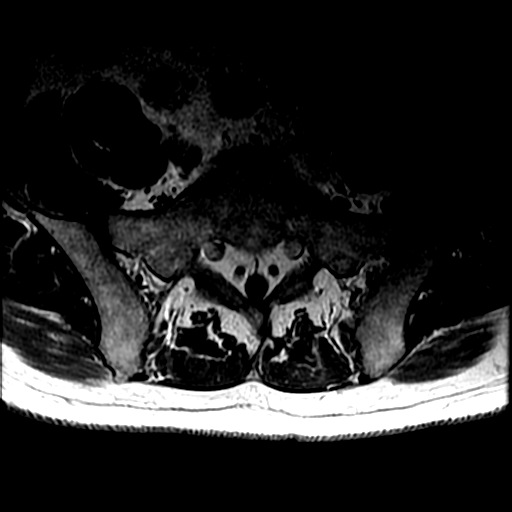

[Series 400: DWI · axial · 3.0mm · 1.09mm/px · z∈[-72,+77]mm · 2 of 51 slices shown (3 of 4)]
[im 1/51]
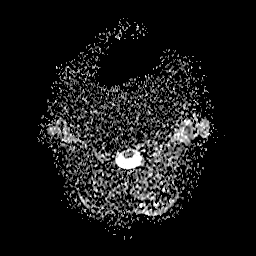
[im 51/51]
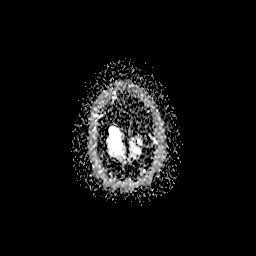

[Series 600: DWI · coronal · 5.0mm · 1.09mm/px · 1 of 34 slices shown (4 of 4)]
[im 1/34]
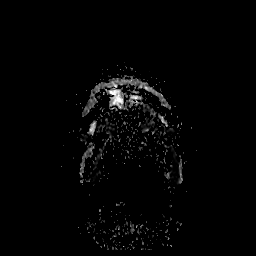

[29 of 48 positions shown; findings below may reference images not displayed]

FINDINGS: Brain: 2 small FLAIR hyperintensities in the cerebral white matter.
No specific demyelinating pattern. No signal abnormality along the
third ventricle or cerebral aqua duct. No acute infarct, hemorrhage,
hydrocephalus, or mass. Normal brain volume.

Vascular: Normal flow voids.

Skull and upper cervical spine: Negative.

Sinuses/Orbits: No evidence of optic neuritis.

Small mucous retention cysts in the right sphenoid sinus.
IMPRESSION: No explanation for myelopathy.  No demyelinating pattern.

## 2020-01-18 ENCOUNTER — Ambulatory Visit: Payer: Managed Care, Other (non HMO) | Admitting: Orthopedic Surgery
